# Patient Record
Sex: Female | Born: 1970
Health system: Southern US, Community
[De-identification: ages and names within clinical notes are randomized; demographics above are authoritative.]

## PROBLEM LIST (undated history)

## (undated) DIAGNOSIS — T7840XA Allergy, unspecified, initial encounter: Secondary | ICD-10-CM

## (undated) DIAGNOSIS — D649 Anemia, unspecified: Secondary | ICD-10-CM

## (undated) HISTORY — DX: Allergy, unspecified, initial encounter: T78.40XA

## (undated) HISTORY — DX: Anemia, unspecified: D64.9

---

## 1994-04-19 HISTORY — PX: CRYOTHERAPY: SHX1416

## 2008-01-24 ENCOUNTER — Ambulatory Visit: Payer: Self-pay | Admitting: Family Medicine

## 2008-01-24 DIAGNOSIS — R5382 Chronic fatigue, unspecified: Secondary | ICD-10-CM | POA: Insufficient documentation

## 2008-01-24 DIAGNOSIS — D509 Iron deficiency anemia, unspecified: Secondary | ICD-10-CM | POA: Insufficient documentation

## 2008-01-24 DIAGNOSIS — R5383 Other fatigue: Secondary | ICD-10-CM | POA: Insufficient documentation

## 2008-01-24 DIAGNOSIS — J309 Allergic rhinitis, unspecified: Secondary | ICD-10-CM | POA: Insufficient documentation

## 2008-01-24 DIAGNOSIS — R5381 Other malaise: Secondary | ICD-10-CM

## 2008-01-26 ENCOUNTER — Ambulatory Visit: Payer: Self-pay | Admitting: Family Medicine

## 2008-01-31 LAB — CONVERTED CEMR LAB
ALT: 16 units/L (ref 0–35)
Albumin: 4.1 g/dL (ref 3.5–5.2)
Alkaline Phosphatase: 46 units/L (ref 39–117)
BUN: 8 mg/dL (ref 6–23)
Basophils Relative: 0.2 % (ref 0.0–3.0)
Bilirubin, Direct: 0.2 mg/dL (ref 0.0–0.3)
CO2: 30 meq/L (ref 19–32)
Calcium: 9.1 mg/dL (ref 8.4–10.5)
Eosinophils Relative: 0.9 % (ref 0.0–5.0)
Glucose, Bld: 85 mg/dL (ref 70–99)
HCT: 33.8 % — ABNORMAL LOW (ref 36.0–46.0)
Hemoglobin: 11.8 g/dL — ABNORMAL LOW (ref 12.0–15.0)
LDL Cholesterol: 94 mg/dL (ref 0–99)
Lymphocytes Relative: 25.5 % (ref 12.0–46.0)
Monocytes Absolute: 0.6 10*3/uL (ref 0.1–1.0)
Monocytes Relative: 9.5 % (ref 3.0–12.0)
Neutro Abs: 4.3 10*3/uL (ref 1.4–7.7)
RBC: 3.46 M/uL — ABNORMAL LOW (ref 3.87–5.11)
RDW: 12.4 % (ref 11.5–14.6)
Sodium: 139 meq/L (ref 135–145)
TSH: 0.91 microintl units/mL (ref 0.35–5.50)
Total CHOL/HDL Ratio: 3.4
Total Protein: 6.8 g/dL (ref 6.0–8.3)
WBC: 6.7 10*3/uL (ref 4.5–10.5)

## 2008-02-23 ENCOUNTER — Ambulatory Visit: Payer: Self-pay | Admitting: Family Medicine

## 2008-02-23 ENCOUNTER — Encounter: Payer: Self-pay | Admitting: Family Medicine

## 2008-02-23 ENCOUNTER — Other Ambulatory Visit: Admission: RE | Admit: 2008-02-23 | Discharge: 2008-02-23 | Payer: Self-pay | Admitting: Family Medicine

## 2008-02-23 DIAGNOSIS — L708 Other acne: Secondary | ICD-10-CM | POA: Insufficient documentation

## 2008-03-01 ENCOUNTER — Encounter (INDEPENDENT_AMBULATORY_CARE_PROVIDER_SITE_OTHER): Payer: Self-pay | Admitting: *Deleted

## 2008-03-20 ENCOUNTER — Ambulatory Visit: Payer: Self-pay | Admitting: Family Medicine

## 2008-08-16 ENCOUNTER — Ambulatory Visit: Payer: Self-pay | Admitting: Internal Medicine

## 2009-05-26 ENCOUNTER — Telehealth: Payer: Self-pay | Admitting: Family Medicine

## 2009-06-03 ENCOUNTER — Ambulatory Visit: Payer: Self-pay | Admitting: Family Medicine

## 2009-06-03 ENCOUNTER — Other Ambulatory Visit: Admission: RE | Admit: 2009-06-03 | Discharge: 2009-06-03 | Payer: Self-pay | Admitting: Family Medicine

## 2009-06-05 ENCOUNTER — Encounter (INDEPENDENT_AMBULATORY_CARE_PROVIDER_SITE_OTHER): Payer: Self-pay | Admitting: *Deleted

## 2010-01-08 ENCOUNTER — Ambulatory Visit: Payer: Self-pay | Admitting: Family Medicine

## 2010-01-30 ENCOUNTER — Telehealth: Payer: Self-pay | Admitting: Family Medicine

## 2010-05-19 NOTE — Progress Notes (Signed)
Summary: flu symptoms  Phone Note Call from Patient Call back at Home Phone [phone]   Caller: Patient Call For: Kerby Nora MD Summary of Call: Patient's son was just diagnosed with having the flu, and patient says that she is starting to show symptoms. She wants to know if she can have some tamiflu called in to [facility] [address] Initial call taken by: Melody Comas,  May 26, 2009 1:20 PM    New/Updated Medications: TAMIFLU 75 MG CAPS (OSELTAMIVIR PHOSPHATE) Take one capsule by mouth twice a day Prescriptions: TAMIFLU 75 MG CAPS (OSELTAMIVIR PHOSPHATE) Take one capsule by mouth twice a day  #10 x 0   Entered and Authorized by:   Ruthe Mannan MD   Signed by:   Ruthe Mannan MD on 05/26/2009   Method used:   Electronically to        [facility]. [address] [phone]* (retail)       [address] [place], [place]  [id number]       Ph: [phone]       Fax: [phone]   RxID:   [id number]   Appended Document: flu symptoms Patient Advised.

## 2010-05-19 NOTE — Assessment & Plan Note (Signed)
Summary: Lynn Ortiz/CLE   Vital Signs:  Patient profile:   40 year old female Height:      63.5 inches Weight:      153.6 pounds BMI:     26.88 Temp:     98.5 degrees F oral Pulse rate:   76 / minute Pulse rhythm:   regular BP sitting:   110 / 70  (left arm) Cuff size:   regular  Vitals Entered By: Benny Lennert CMA Duncan Dull) (June 03, 2009 11:50 AM)  History of Present Illness: Chief complaint Lynn Ortiz  The patient is here for annual wellness exam and preventative care.      Problems Prior to Update: 1)  Routine Gynecological Examination  (ICD-V72.31) 2)  Routine General Medical Exam@health  Care Facl  (ICD-V70.0) 3)  Acne Vulgaris  (ICD-706.1) 4)  Screening For Lipoid Disorders  (ICD-V77.91) 5)  Fatigue  (ICD-780.79) 6)  Allergic Rhinitis  (ICD-477.9) 7)  Anemia-iron Deficiency  (ICD-280.9)  Current Medications (verified): 1)  Multivitamins   Tabs (Multiple Vitamin) .... Take 1 Tablet By Mouth Once A Day  Allergies (verified): No Known Drug Allergies  Past History:  Past medical, surgical, family and social histories (including risk factors) reviewed, and no changes noted (except as noted below).  Past Medical History: Reviewed history from 01/24/2008 and no changes required. Anemia-iron deficiency Allergic rhinitis  Past Surgical History: Reviewed history from 01/24/2008 and no changes required. C-section 2003,  NSVD, twin 2005 cryotherapy  for dysplasia of cervix 1996  Family History: Reviewed history from 01/24/2008 and no changes required. father: emphesema mother: healthy sister; vit B12 deficiency no MI < age 73  Social History: Reviewed history from 01/24/2008 and no changes required. 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 Resp:   Denies shortness of breath. GI:  Denies abdominal pain, bloody stools, constipation, and diarrhea. GU:  Denies abnormal vaginal bleeding, discharge, and dysuria; no breast lesions.  .  Physical Exam  General:  Well-developed,well-nourished,in no acute distress; alert,appropriate and cooperative throughout examination Head:  Normocephalic and atraumatic without obvious abnormalities. No apparent alopecia or balding. Ears:  External ear exam shows no significant lesions or deformities.  Otoscopic examination reveals clear canals, tympanic membranes are intact bilaterally without bulging, retraction, inflammation or discharge. Hearing is grossly normal bilaterally. Nose:  External nasal examination shows no deformity or inflammation. Nasal mucosa are pink and moist without lesions or exudates. Mouth:  Oral mucosa and oropharynx without lesions or exudates.  Teeth in good repair. Neck:  No deformities, masses, or tenderness noted. Chest Wall:  No deformities, masses, or tenderness noted. Breasts:  No mass, nodules, thickening, tenderness, bulging, retraction, inflamation, nipple discharge or skin changes noted.   Lungs:  Normal respiratory effort, chest expands symmetrically. Lungs are clear to auscultation, no crackles or wheezes. Heart:  Normal rate and regular rhythm. S1 and S2 normal without gallop, murmur, click, rub or other extra sounds. Abdomen:  Bowel sounds positive,abdomen soft and non-tender without masses, organomegaly or hernias noted. Genitalia:  Pelvic Exam:        External: normal female genitalia without lesions or masses        Vagina: normal without lesions or masses        Cervix: normal without lesions or masses        Adnexa: normal bimanual exam without  masses or fullness        Uterus: normal by palpation        Pap smear: performed Pulses:  R and L posterior tibial pulses are full and equal bilaterally  Extremities:  no edema Neurologic:  No cranial nerve deficits  noted. Station and gait are normal. Plantar reflexes are down-going bilaterally. DTRs are symmetrical throughout. Sensory, motor and coordinative functions appear intact. Skin:  Intact without suspicious lesions or rashes Psych:  Cognition and judgment appear intact. Alert and cooperative with normal attention span and concentration. No apparent delusions, illusions, hallucinations   Impression & Recommendations:  Problem # 1:  Preventive Health Care (ICD-V70.0) The patient's preventative maintenance and recommended screening tests for an annual wellness exam were reviewed in full today. Brought up to date unless services declined.  Counselled on the importance of diet, exercise, and its role in overall health and mortality. The patient's FH and SH was reviewed, including their home life, tobacco status, and drug and alcohol status.     Problem # 2:  Gynecological examination-routine (ICD-V72.31) PAP pending..if negative can spacce to every 2 years.   Complete Medication List: 1)  Multivitamins Tabs (Multiple vitamin) .... Take 1 tablet by mouth once a day  Patient Instructions: 1)  Please schedule a follow-up appointment in 1 year.   Current Allergies (reviewed today): No known allergies   Flu Vaccine Result Date:  01/17/2009 Flu Vaccine Result:  given Flu Vaccine Next Due:  1 yr

## 2010-05-19 NOTE — Letter (Signed)
Summary: Results Follow up Letter  [place] [facility]  [address] [place], [place] [address]   Phone: [phone]  Fax: [phone]    06/05/2009 MRN: [id number]     [facility] [address] [place], [place]  [address]    Dear Lynn Ortiz,  The following are the results of your recent test(s):  Test         Result    Pap Smear:        Normal ___x__  Not Normal _____ Comments:Repeat in 1 year ______________________________________________________ Cholesterol: LDL(Bad cholesterol):         Your goal is less than:         HDL (Good cholesterol):       Your goal is more than: Comments:  ______________________________________________________ Mammogram:        Normal _____  Not Normal _____ Comments:  ___________________________________________________________________ Hemoccult:        Normal _____  Not normal _______ Comments:    _____________________________________________________________________ Other Tests:    We routinely do not discuss normal results over the telephone.  If you desire a copy of the results, or you have any questions about this information we can discuss them at your next office visit.   Sincerely,  Kerby Nora MD

## 2010-05-19 NOTE — Progress Notes (Signed)
Summary: not much better  Phone Note Call from Patient Call back at [phone]   Caller: Patient Call For: Dr. Para March Summary of Call: Pt was seen on 9/22 for strep.  She finished her abx but has not felt better since.  Throat did get some better and fevers are now low grade.  She is asking if she should get another round of abx called to [facility] [address]   Initial call taken by: Lowella Petties CMA,  January 30, 2010 4:14 PM  Follow-up for Phone Call        Temp 99 this AM, ST some better.  Still fatigued.  I talked with patient.  I doubt she is still infected with strep w/o the fever.  I would rest over the weekend and call back as needed on Monday.   Supportive tx in meantime. She agrees. No new antibiotics.  Follow-up by: Crawford Givens MD,  January 30, 2010 5:25 PM

## 2010-05-19 NOTE — Assessment & Plan Note (Signed)
Summary: ST,FEVER/CLE   Vital Signs:  Patient profile:   40 year old female Height:      63.5 inches Weight:      153 pounds BMI:     26.77 Temp:     98.8 degrees F oral Pulse rate:   84 / minute Pulse rhythm:   regular BP sitting:   98 / 70  (left arm) Cuff size:   regular  Vitals Entered By: Delilah Shan CMA Duncan Dull) (January 08, 2010 2:17 PM) CC: ST, fever   History of Present Illness: Monday night patient noted that she wasn't feeling well.  Out of work Wednesday.  Fever 103 then. Sleeping most of yesterday and today.  ST.  No sig cough.  Feels hot.  Occ ear pain.  Recent contact with  strep case.  Minimal rhinorrhea, no GI symptoms.  Mild voice change.   Allergies: No Known Drug Allergies  Review of Systems       See HPI.  Otherwise negative.    Physical Exam  General:  GEN: nad, alert and oriented HEENT: mucous membranes moist, TM w/o erythema, nasal epithelium injected, OP with cobblestoning and bilateral tonsillar exudates NECK: supple w/ LA that is tender to palpation  CV: rrr. PULM: ctab, no inc wob ABD: soft, +bs EXT: no edema    Impression & Recommendations:  Problem # 1:  STREPTOCOCCAL PHARYNGITIS (ICD-034.0) See instructions.  d/w patient and she understands.  RST positive.  Her updated medication list for this problem includes:    Amoxicillin 875 Mg Tabs (Amoxicillin) .Marland Kitchen... 1 by mouth two times a day  Complete Medication List: 1)  Multivitamins Tabs (Multiple vitamin) .... Take 1 tablet by mouth once a day 2)  Amoxicillin 875 Mg Tabs (Amoxicillin) .Marland Kitchen.. 1 by mouth two times a day  Patient Instructions: 1)  Take the amoxicillin two times a day.  Take tylenol 500mg , 2 tabs three times a day for fever.  Gargle with salt water.  Try to rest and drink plenty of fluids.  Take care.  Prescriptions: AMOXICILLIN 875 MG TABS (AMOXICILLIN) 1 by mouth two times a day  #20 x 0   Entered and Authorized by:   Crawford Givens MD   Signed by:   Crawford Givens MD on  01/08/2010   Method used:   Electronically to        [facility]. [address] [phone]* (retail)       [address] [place], [place]  [id number]       Ph: [phone]       Fax: [phone]   RxID:   [id number]   Current Allergies (reviewed today): No known allergies

## 2010-06-05 ENCOUNTER — Other Ambulatory Visit: Payer: Self-pay | Admitting: Family Medicine

## 2010-06-05 ENCOUNTER — Encounter: Payer: Self-pay | Admitting: Family Medicine

## 2010-06-05 ENCOUNTER — Ambulatory Visit (INDEPENDENT_AMBULATORY_CARE_PROVIDER_SITE_OTHER): Payer: BC Managed Care – PPO | Admitting: Family Medicine

## 2010-06-05 DIAGNOSIS — R5383 Other fatigue: Secondary | ICD-10-CM

## 2010-06-05 DIAGNOSIS — R5381 Other malaise: Secondary | ICD-10-CM

## 2010-06-05 LAB — BASIC METABOLIC PANEL
CO2: 29 mEq/L (ref 19–32)
Chloride: 104 mEq/L (ref 96–112)
Glucose, Bld: 65 mg/dL — ABNORMAL LOW (ref 70–99)
Sodium: 139 mEq/L (ref 135–145)

## 2010-06-05 LAB — HEPATIC FUNCTION PANEL
ALT: 24 U/L (ref 0–35)
Alkaline Phosphatase: 64 U/L (ref 39–117)
Bilirubin, Direct: 0.1 mg/dL (ref 0.0–0.3)
Total Protein: 7.3 g/dL (ref 6.0–8.3)

## 2010-06-05 LAB — B12 AND FOLATE PANEL: Folate: 12.4 ng/mL (ref >5.9)

## 2010-06-09 LAB — CONVERTED CEMR LAB
Basophils Absolute: 0 10*3/uL (ref 0.0–0.1)
Hemoglobin: 12.9 g/dL (ref 12.0–15.0)
Lymphocytes Relative: 39 % (ref 12–46)
Neutro Abs: 4.1 10*3/uL (ref 1.7–7.7)
Neutrophils Relative %: 53 % (ref 43–77)
Platelets: 199 10*3/uL (ref 150–400)
RDW: 12.8 % (ref 11.5–15.5)

## 2010-06-10 ENCOUNTER — Encounter (INDEPENDENT_AMBULATORY_CARE_PROVIDER_SITE_OTHER): Payer: Self-pay | Admitting: *Deleted

## 2010-06-10 NOTE — Assessment & Plan Note (Signed)
Summary: TIRED ALL THE TIME/CLE   [employer]   Vital Signs:  Patient profile:   40 year old female Weight:      157 pounds Temp:     98.4 degrees F oral Pulse rate:   72 / minute Pulse rhythm:   regular BP sitting:   120 / 78  (left arm) Cuff size:   regular  Vitals Entered By: Lowella Petties CMA, AAMA (June 05, 2010 12:32 PM) CC: Feels tired all the time, "doesnt think like she used to"   History of Present Illness:  40 year old previously healthy female resents with worsening of fatigue in last several months  Has three children, works. finished grad Degree 03/2009 Has history of fatigue but it is worse recently.   Has gained. Minimal exercise. Also notes tat if she has several things to do she wants to lay down... cannot multiple projects as once. Feels like thinking through cotton.  Feels somewhat overwhelmed.. but not really generalize anxiety or depression.  Mother with thyroid issues.   Regular menses.. no heavy.   Hx of iron def anemia, mild. No sleep problems at night "I sleep hard", no snoring. Sometimes sleeps 10 hours, but wakes up tired.   Problems Prior to Update: 1)  Streptococcal Pharyngitis  (ICD-034.0) 2)  Routine Gynecological Examination  (ICD-V72.31) 3)  Routine General Medical Exam@health  Care Facl  (ICD-V70.0) 4)  Acne Vulgaris  (ICD-706.1) 5)  Screening For Lipoid Disorders  (ICD-V77.91) 6)  Fatigue  (ICD-780.79) 7)  Allergic Rhinitis  (ICD-477.9) 8)  Anemia-iron Deficiency  (ICD-280.9)  Current Medications (verified): 1)  Multivitamins   Tabs (Multiple Vitamin) .... Take 1 Tablet By Mouth Once A Day  Allergies: No Known Drug Allergies  Past History:  Past medical, surgical, family and social histories (including risk factors) reviewed, and no changes noted (except as noted below).  Past Medical History: Reviewed history from 01/24/2008 and no changes required. Anemia-iron deficiency Allergic rhinitis  Past Surgical History: Reviewed  history from 01/24/2008 and no changes required. C-section 2003,  NSVD, twin 2005 cryotherapy  for dysplasia of cervix 1996  Family History: Reviewed history from 01/24/2008 and no changes required. father: emphesema mother: healthy sister; vit B12 deficiency no MI < age 32  Social History: Reviewed history from 01/24/2008 and no changes required. 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 CV:  Denies chest pain or discomfort. Resp:  Denies shortness of breath. GI:  Denies abdominal pain, bloody stools, constipation, and diarrhea. GU:  Denies dysuria. Derm:  Complains of dryness. Psych:  Denies anxiety, depression, panic attacks, and suicidal thoughts/plans. Endo:  Complains of cold intolerance; denies heat intolerance; dry skin.  Physical Exam  General:  fatigued appearing female in NAd  Eyes:  No corneal or conjunctival inflammation noted. EOMI. Perrla. Funduscopic exam benign, without hemorrhages, exudates or papilledema. Vision grossly normal. Ears:  External ear exam shows no significant lesions or deformities.  Otoscopic examination reveals clear canals, tympanic membranes are intact bilaterally without bulging, retraction, inflammation or discharge. Hearing is grossly normal bilaterally. Nose:  External nasal examination shows no deformity or inflammation. Nasal mucosa are pink and moist without lesions or exudates. Mouth:  MMM Neck:  no carotid bruit or thyromegaly no cervical or supraclavicular lymphadenopathy  Lungs:  Normal respiratory effort, chest  expands symmetrically. Lungs are clear to auscultation, no crackles or wheezes. Heart:  Normal rate and regular rhythm. S1 and S2 normal without gallop, murmur, click, rub or other  extra sounds. Abdomen:  Bowel sounds positive,abdomen soft and non-tender without masses, organomegaly or hernias noted. Pulses:  R and L posterior tibial pulses are full and equal bilaterally  Extremities:  no edema  Skin:  Intact without suspicious lesions or rashes Psych:  Cognition and judgment appear intact. Alert and cooperative with normal attention span and concentration. No apparent delusions, illusions, hallucinations   Impression & Recommendations:  Problem # 1:  FATIGUE (ICD-780.79) Eval for fatigue with labs. Symptoms may be due to chronic stress, mild anxiety.. consider further treatment of this at next OV.Marland Kitchen if lab eval neg.  No cardiac or pulmfindings, no suggestion of sleep issues.   Orders: TLB-B12 + Folate Pnl (82746_82607-B12/FOL) TLB-BMP (Basic Metabolic Panel-BMET) (80048-METABOL) TLB-CBC Platelet - w/Differential (85025-CBCD) TLB-Hepatic/Liver Function Pnl (80076-HEPATIC) TLB-TSH (Thyroid Stimulating Hormone) (84443-TSH)  Complete Medication List: 1)  Multivitamins Tabs (Multiple vitamin) .... Take 1 tablet by mouth once a day  Patient Instructions: 1)  Schedule CPX in 1-2 months.  2)   eat three meals a day.. protein, fruit and veggies. 3)  Exercise 3-5 times  a day.    Orders Added: 1)  TLB-B12 + Folate Pnl [82746_82607-B12/FOL] 2)  TLB-BMP (Basic Metabolic Panel-BMET) [80048-METABOL] 3)  TLB-CBC Platelet - w/Differential [85025-CBCD] 4)  TLB-Hepatic/Liver Function Pnl [80076-HEPATIC] 5)  TLB-TSH (Thyroid Stimulating Hormone) [84443-TSH] 6)  Est. Patient Level III [[address]]    Prior Medications (reviewed today): MULTIVITAMINS   TABS (MULTIPLE VITAMIN) Take 1 tablet by mouth once a day Current Allergies: No known allergies

## 2010-06-15 ENCOUNTER — Encounter: Payer: Self-pay | Admitting: Family Medicine

## 2010-06-15 ENCOUNTER — Encounter (INDEPENDENT_AMBULATORY_CARE_PROVIDER_SITE_OTHER): Payer: BC Managed Care – PPO | Admitting: Family Medicine

## 2010-06-15 ENCOUNTER — Other Ambulatory Visit: Payer: Self-pay | Admitting: Family Medicine

## 2010-06-15 DIAGNOSIS — Z0000 Encounter for general adult medical examination without abnormal findings: Secondary | ICD-10-CM

## 2010-06-15 DIAGNOSIS — E876 Hypokalemia: Secondary | ICD-10-CM | POA: Insufficient documentation

## 2010-06-15 DIAGNOSIS — Z01419 Encounter for gynecological examination (general) (routine) without abnormal findings: Secondary | ICD-10-CM

## 2010-06-15 DIAGNOSIS — F411 Generalized anxiety disorder: Secondary | ICD-10-CM | POA: Insufficient documentation

## 2010-06-15 DIAGNOSIS — E162 Hypoglycemia, unspecified: Secondary | ICD-10-CM

## 2010-06-15 LAB — HEMOGLOBIN A1C: Hgb A1c MFr Bld: 4.5 % — ABNORMAL LOW (ref 4.6–6.5)

## 2010-06-15 LAB — CONVERTED CEMR LAB

## 2010-06-15 LAB — BASIC METABOLIC PANEL
BUN: 9 mg/dL (ref 6–23)
Calcium: 9.2 mg/dL (ref 8.4–10.5)
Creatinine, Ser: 0.6 mg/dL (ref 0.4–1.2)
GFR: 109.75 mL/min (ref >60.00)
Glucose, Bld: 76 mg/dL (ref 70–99)

## 2010-06-15 LAB — HM PAP SMEAR

## 2010-06-16 NOTE — Letter (Signed)
Summary: Generic Letter  [place] [facility]  [address] [place], [place] [address]   Phone: [phone]  Fax: [phone]    06/10/2010     Lynn Ortiz [address] [place], [place]  [address]  [place]    Dear Lynn Ortiz,   Notify pt no anemia.. have her return for lab follow up of other labs... A1C and BMET fasting Dx 780.79 Work on healthy lifestyle cahnges... regular exercise and healthy eating. Do not skip meals.        Sincerely,   Kerby Nora MD

## 2010-06-25 NOTE — Assessment & Plan Note (Signed)
Summary: cpe jrt   Vital Signs:  Patient profile:   40 year old female Height:      63.5 inches Weight:      155.0 pounds BMI:     27.12 Temp:     98.9 degrees F oral Pulse rate:   72 / minute Pulse rhythm:   regular BP sitting:   110 / 60  (left arm) Cuff size:   regular  Vitals Entered By: Benny Lennert CMA Duncan Dull) (June 15, 2010 11:31 AM)  History of Present Illness: Chief complaint cpx  The patient is here for annual wellness exam and preventative care.    Fatigue.. recent lab eval nml except low glucose... reviewed with pt in detail.  Will check BMET and A1C.     Problems Prior to Update: 1)  Routine Gynecological Examination  (ICD-V72.31) 2)  Routine General Medical Exam@health  Care Facl  (ICD-V70.0) 3)  Acne Vulgaris  (ICD-706.1) 4)  Screening For Lipoid Disorders  (ICD-V77.91) 5)  Fatigue  (ICD-780.79) 6)  Allergic Rhinitis  (ICD-477.9) 7)  Anemia-iron Deficiency  (ICD-280.9)  Current Medications (verified): 1)  Multivitamins   Tabs (Multiple Vitamin) .... Take 1 Tablet By Mouth Once A Day  Allergies (verified): No Known Drug Allergies  Past History:  Past medical, surgical, family and social histories (including risk factors) reviewed, and no changes noted (except as noted below).  Past Medical History: Reviewed history from 01/24/2008 and no changes required. Anemia-iron deficiency Allergic rhinitis  Past Surgical History: Reviewed history from 01/24/2008 and no changes required. C-section 2003,  NSVD, twin 2005 cryotherapy  for dysplasia of cervix 1996  Family History: Reviewed history from 01/24/2008 and no changes required. father: emphesema mother: healthy sister; vit B12 deficiency no MI < age 5  Social History: Reviewed history from 01/24/2008 and no changes required. 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 CV:  Denies chest pain or discomfort. Resp:  Denies shortness of breath. GI:  Denies abdominal pain. GU:  Denies dysuria.  Physical Exam  General:  Well-developed,well-nourished,in no acute distress; alert,appropriate and cooperative throughout examination Eyes:  No corneal or conjunctival inflammation noted. EOMI. Perrla. Funduscopic exam benign, without hemorrhages, exudates or papilledema. Vision grossly normal. Ears:  External ear exam shows no significant lesions or deformities.  Otoscopic examination reveals clear canals, tympanic membranes are intact bilaterally without bulging, retraction, inflammation or discharge. Hearing is grossly normal bilaterally. Nose:  External nasal examination shows no deformity or inflammation. Nasal mucosa are pink and moist without lesions or exudates. Mouth:  Oral mucosa and oropharynx without lesions or exudates.  Teeth in good repair. Neck:  no carotid bruit or thyromegaly no cervical or supraclavicular lymphadenopathy  Chest Wall:  No deformities, masses, or tenderness noted. Breasts:  No mass, nodules, thickening, tenderness, bulging, retraction, inflamation, nipple discharge or skin changes noted.   Lungs:  Normal respiratory effort, chest expands symmetrically. Lungs are clear to auscultation, no crackles or wheezes. Heart:  Normal rate and regular rhythm. S1 and S2 normal without gallop, murmur, click, rub or other extra sounds. Abdomen:  Bowel sounds positive,abdomen soft and non-tender without masses, organomegaly or hernias noted. Genitalia:  normal introitus, no external lesions, no vaginal discharge, normal uterus size and position, and no adnexal masses or tenderness.   Msk:  No deformity or scoliosis noted  of thoracic or lumbar spine.   Pulses:  R and L posterior tibial pulses are full and equal bilaterally  Extremities:  no  edema Neurologic:  No cranial nerve deficits noted. Station and gait are normal. Plantar reflexes are down-going bilaterally. DTRs are symmetrical throughout. Sensory, motor and coordinative functions appear intact. Skin:  Intact without suspicious lesions or rashes Psych:  Cognition and judgment appear intact. Alert and cooperative with normal attention span and concentration. No apparent delusions, illusions, hallucinations   Impression & Recommendations:  Problem # 1:  Preventive Health Care (ICD-V70.0) The patient's preventative maintenance and recommended screening tests for an annual wellness exam were reviewed in full today. Brought up to date unless services declined.  Counselled on the importance of diet, exercise, and its role in overall health and mortality. The patient's FH and SH was reviewed, including their home life, tobacco status, and drug and alcohol status.     Problem # 2:  ROUTINE GYNECOLOGICAL EXAMINATION (ICD-V72.31) DVE , no PAP  Problem # 3:  FATIGUE (ICD-780.79) Lab eval nml except as below.  She states she feels that being overwhelmed and stress for so long has made her drop into fatigue. Exercsiing one day 2 weeks ago.Marland Kitchen amde her feel much better.  See # 6.   Problem # 4:  HYPOGLYCEMIA (ICD-251.2) Reeval glucose and check A1C.  Eat three meals daily with snacks... high in protein and fiber.  Decrease high carb loads.  Orders: TLB-BMP (Basic Metabolic Panel-BMET) (80048-METABOL) TLB-A1C / Hgb A1C (Glycohemoglobin) (83036-A1C)  Problem # 5:  HYPOKALEMIA (ICD-276.8) Recheck.  Orders: TLB-BMP (Basic Metabolic Panel-BMET) (80048-METABOL)  Problem # 6:  ANXIETY (ICD-300.00)  Will start treating with low dose sertraline at bedtime. Follow up in 1 month.  No SI.  Her updated medication list for this problem includes:    Sertraline Hcl 25 Mg Tabs (Sertraline hcl) .Marland Kitchen... 1 tab by mouth daily at beditme  Complete Medication List: 1)  Multivitamins Tabs  (Multiple vitamin) .... Take 1 tablet by mouth once a day 2)  Sertraline Hcl 25 Mg Tabs (Sertraline hcl) .Marland Kitchen.. 1 tab by mouth daily at beditme  Patient Instructions: 1)  Start exercising.Marland Kitchen 2-3 times a week. 2)  Eat three meals a day with healthy snack in between... high in fiber and protein. 3)  Start sertraline 25 mg daily. 4)  We will call with lab results.  5)   Follow up mood in 1 month.  Prescriptions: SERTRALINE HCL 25 MG TABS (SERTRALINE HCL) 1 tab by mouth daily at beditme  #30 x 3   Entered and Authorized by:   Kerby Nora MD   Signed by:   Kerby Nora MD on 06/15/2010   Method used:   Electronically to        [facility]. [address] [phone]* (retail)       [address] [place], [place]  [id number]       Ph: [phone]       Fax: [phone]   RxID:   [id number]    Orders Added: 1)  TLB-BMP (Basic Metabolic Panel-BMET) [80048-METABOL] 2)  TLB-A1C / Hgb A1C (Glycohemoglobin) [83036-A1C] 3)  Est. Patient 18-39 years [[address]]    Current Allergies (reviewed today): No known allergies   Last PAP:  NEGATIVE FOR INTRAEPITHELIAL LESIONS OR MALIGNANCY. (06/03/2009 12:00:00 AM) PAP Result Date:  06/15/2010 PAP Result:  DVE this year, nml, pap q2-3 years PAP Next Due:  1 yr

## 2010-12-02 ENCOUNTER — Ambulatory Visit (INDEPENDENT_AMBULATORY_CARE_PROVIDER_SITE_OTHER): Payer: BC Managed Care – PPO | Admitting: Family Medicine

## 2010-12-02 ENCOUNTER — Encounter: Payer: Self-pay | Admitting: Family Medicine

## 2010-12-02 ENCOUNTER — Ambulatory Visit: Payer: BC Managed Care – PPO | Admitting: Family Medicine

## 2010-12-02 DIAGNOSIS — J209 Acute bronchitis, unspecified: Secondary | ICD-10-CM | POA: Insufficient documentation

## 2010-12-02 MED ORDER — GUAIFENESIN-CODEINE 100-10 MG/5ML PO SYRP: ORAL_SOLUTION | ORAL | Status: DC

## 2010-12-02 MED ORDER — AZITHROMYCIN 250 MG PO TABS: ORAL_TABLET | ORAL | Status: AC

## 2010-12-02 NOTE — Assessment & Plan Note (Signed)
With one week of symptoms - productive cough and fever that are persistant Reassuring exam- no reactive airways Will be traveling with her mother with copd- rev good habits to prev transmission  Will cover with zithromax and update Also robitussin ac with caution  Adv to call if worse or not improving in 1-2 weeks (pt will be on a cruise and access to Dr there as well)

## 2010-12-02 NOTE — Patient Instructions (Signed)
Drink lots of fluids Take zithromax as directed  Use cough medicine as needed with caution of sedation  Tylenol for fever  Update [place] if worse / short of breath / wheezing or not improving in 1-2 weeks

## 2010-12-02 NOTE — Progress Notes (Signed)
  Subjective:    Patient ID: Lynn Ortiz, female    DOB: December 08, 1970, 40 y.o.   MRN: [id number]  HPI Started sat with 100 deg fever - she took some tylenol which helped Congestion also in chest Taking mucinex DM   Is leaving tonight - on cruise with mother  She has copd   Took 2 tylenol at 9   Is coughing up phlegm - that is thick and green or white No blood  Not a lot of it   No nasal or ear or throat symptoms at all  Patient Active Problem List  Diagnoses  . ANEMIA-IRON DEFICIENCY  . ALLERGIC RHINITIS  . ACNE VULGARIS  . FATIGUE  . HYPOGLYCEMIA  . HYPOKALEMIA  . ANXIETY  . Acute bronchitis   Past Medical History  Diagnosis Date  . Anemia     iron deficiency  . Allergy     allerlgic rhinitis   Past Surgical History  Procedure Date  . Cesarean section     2003 NSVD, twin 2005  . Cryotherapy 1996    for dysplasia of cervix   History  Substance Use Topics  . Smoking status: Never Smoker   . Smokeless tobacco: Not on file  . Alcohol Use: Yes     2 drinks per week   Family History  Problem Relation Age of Onset  . Emphysema Father    No Known Allergies No current outpatient prescriptions on file prior to visit.         Review of Systems Review of Systems  Constitutional: Negative for fever, appetite change, fatigue and unexpected weight change.  Eyes: Negative for pain and visual disturbance.  Respiratory: neg for sob or wheezing/ pos for prod cough ENT neg for congestion/ sore throat or ear pain Cardiovascular: Negative.  for cp or palpitations Gastrointestinal: Negative for nausea, diarrhea and constipation.  Genitourinary: Negative for urgency and frequency.  Skin: Negative for pallor. Or rash Neurological: Negative for weakness, light-headedness, numbness and headaches.  Hematological: Negative for adenopathy. Does not bruise/bleed easily.  Psychiatric/Behavioral: Negative for dysphoric mood. The patient is not nervous/anxious.            Objective:   Physical Exam  Constitutional: She appears well-developed and well-nourished. No distress.  HENT:  Head: Normocephalic and atraumatic.  Right Ear: External ear normal.  Left Ear: External ear normal.  Nose: Nose normal.  Mouth/Throat: Oropharynx is clear and moist.       Some post nasal drip   Eyes: Conjunctivae and EOM are normal. Pupils are equal, round, and reactive to light. Right eye exhibits no discharge. Left eye exhibits no discharge.  Neck: Normal range of motion. Neck supple. No thyromegaly present.  Cardiovascular: Normal rate, regular rhythm and normal heart sounds.   Pulmonary/Chest: Effort normal and breath sounds normal. No respiratory distress. She has no wheezes. She has no rales.       Harsh bs throughout Also harsh sounding cough  No rales Some isolated rhonchi   Lymphadenopathy:    She has no cervical adenopathy.  Skin: Skin is warm and dry. No rash noted. No erythema. No pallor.  Psychiatric: She has a normal mood and affect.          Assessment & Plan:

## 2011-05-06 ENCOUNTER — Telehealth: Payer: Self-pay | Admitting: Family Medicine

## 2011-05-06 DIAGNOSIS — Z1231 Encounter for screening mammogram for malignant neoplasm of breast: Secondary | ICD-10-CM

## 2011-05-06 NOTE — Telephone Encounter (Signed)
Home Number [phone] Pt called in and wanted to get an order for her 1st Mammo and wanted to go to [facility]. She isn't due for a CPE until February. Not having pain or anything but she stated that now that she is 41 she was told she needs to get a Mammo.

## 2011-07-02 ENCOUNTER — Encounter: Payer: Self-pay | Admitting: Family Medicine

## 2011-07-02 ENCOUNTER — Ambulatory Visit: Payer: Self-pay | Admitting: Family Medicine

## 2011-07-05 LAB — HM MAMMOGRAPHY: HM Mammogram: NORMAL

## 2011-07-06 ENCOUNTER — Encounter: Payer: Self-pay | Admitting: *Deleted

## 2012-07-03 ENCOUNTER — Telehealth: Payer: Self-pay | Admitting: Family Medicine

## 2012-07-03 MED ORDER — PROMETHAZINE HCL 25 MG PO TABS: 25.0000 mg | ORAL_TABLET | Freq: Three times a day (TID) | ORAL | Status: DC | PRN

## 2012-07-03 NOTE — Telephone Encounter (Signed)
Patient Information:  Caller Name: Lynn Ortiz  Phone: [phone]  Patient: Lynn Ortiz, Lynn Ortiz  Gender: Female  DOB: May 25, 1970  Age: 42 Years  PCP: Kerby Nora (Family Practice)  Pregnant: No  Office Follow Up:  Does the office need to follow up with this patient?: Yes  Instructions For The Office: Patient requests prescription for Phenergan instead of coming in for an office visit as recommended by triager. Patient also reports that she is no longer taking Zoloft or any medications at this time.   Symptoms  Reason For Call & Symptoms: Reports having vomiting over the weekend. It has resolved now and she is having abdominal pain/nausea. Reports 2 episodes of vomiting today.  Reviewed Health History In EMR: Yes  Reviewed Medications In EMR: Yes  Reviewed Allergies In EMR: Yes  Reviewed Surgeries / Procedures: Yes  Date of Onset of Symptoms: 06/30/2012 OB / GYN:  LMP: 06/12/2012  Guideline(s) Used:  Vomiting  Disposition Per Guideline:   See Today in Office  Reason For Disposition Reached:   Vomiting lasts > 48 hours  Advice Given:  N/A  Patient Refused Recommendation:  Patient Requests Prescription  Requests prescription for Phenergan instead of the office visit that was recommended.

## 2012-07-03 NOTE — Telephone Encounter (Signed)
Left message on voice mail advising patient script has been sent to pharmacy. 

## 2012-07-03 NOTE — Telephone Encounter (Signed)
Phenergan 25 mg, 1 po q 8 hours prn nausea. #30, 0 refills  ok

## 2012-11-28 ENCOUNTER — Ambulatory Visit (INDEPENDENT_AMBULATORY_CARE_PROVIDER_SITE_OTHER): Payer: BC Managed Care – PPO | Admitting: Family Medicine

## 2012-11-28 ENCOUNTER — Encounter: Payer: Self-pay | Admitting: Family Medicine

## 2012-11-28 VITALS — BP 100/60 | HR 74 | Temp 98.5°F | Ht 64.0 in | Wt 165.5 lb

## 2012-11-28 DIAGNOSIS — R5381 Other malaise: Secondary | ICD-10-CM

## 2012-11-28 DIAGNOSIS — F5104 Psychophysiologic insomnia: Secondary | ICD-10-CM | POA: Insufficient documentation

## 2012-11-28 DIAGNOSIS — D509 Iron deficiency anemia, unspecified: Secondary | ICD-10-CM

## 2012-11-28 DIAGNOSIS — G4700 Insomnia, unspecified: Secondary | ICD-10-CM

## 2012-11-28 DIAGNOSIS — E876 Hypokalemia: Secondary | ICD-10-CM

## 2012-11-28 DIAGNOSIS — R5383 Other fatigue: Secondary | ICD-10-CM

## 2012-11-28 LAB — TSH: TSH: 1.02 u[IU]/mL (ref 0.35–5.50)

## 2012-11-28 LAB — COMPREHENSIVE METABOLIC PANEL
ALT: 26 U/L (ref 0–35)
AST: 28 U/L (ref 0–37)
CO2: 26 mEq/L (ref 19–32)
Chloride: 108 mEq/L (ref 96–112)
GFR: 80.31 mL/min (ref >60.00)
Sodium: 140 mEq/L (ref 135–145)
Total Bilirubin: 0.9 mg/dL (ref 0.3–1.2)
Total Protein: 7.2 g/dL (ref 6.0–8.3)

## 2012-11-28 LAB — CBC WITH DIFFERENTIAL/PLATELET
Basophils Absolute: 0 10*3/uL (ref 0.0–0.1)
Eosinophils Relative: 1.1 % (ref 0.0–5.0)
Monocytes Absolute: 0.4 10*3/uL (ref 0.1–1.0)
Monocytes Relative: 6.1 % (ref 3.0–12.0)
Neutrophils Relative %: 61.7 % (ref 43.0–77.0)
Platelets: 160 10*3/uL (ref 150.0–400.0)
RDW: 12.5 % (ref 11.5–14.6)
WBC: 6 10*3/uL (ref 4.5–10.5)

## 2012-11-28 NOTE — Assessment & Plan Note (Signed)
Discussed sleep hygiene and sleep retraining. Pt given info. May try melatonin.  Call if not improving in 3-4 weeks for trial of trazodone.

## 2012-11-28 NOTE — Assessment & Plan Note (Signed)
Likely due to sleep difficulty, but we will rule out other causes given pt personal and family history.

## 2012-11-28 NOTE — Patient Instructions (Addendum)
Review sleep hygiene. Start sleep retraining. Stop at lab on your way out. Start melatonin 3 mg at bedtime. Call if symptoms not improving in 1 month.

## 2012-11-28 NOTE — Progress Notes (Signed)
  Subjective:    Patient ID: Lynn Ortiz, female    DOB: 1970/11/19, 42 y.o.   MRN: [id number]  HPI  42 year old female with history of anemia and anxiety presents with trouble sleeping in last year, worst in last few months. She has several nights a week when she wakes up in middle of night and she cannot go back to sleep. Did not get better, even got worse with exercise earlier in year.  She feels like her mood level is better this year than last has been off sertraline  ( has only been on this). Less anxious, no depression.   She feels very fatigued and is concerned as to whether she is anemic, mother with hsitory of thyroid issues. Review of Systems  Constitutional: Negative for fever and fatigue.  HENT: Negative for ear pain.   Eyes: Negative for pain.  Respiratory: Negative for chest tightness and shortness of breath.   Cardiovascular: Negative for chest pain, palpitations and leg swelling.  Gastrointestinal: Negative for abdominal pain.  Genitourinary: Negative for dysuria.       Objective:   Physical Exam  Constitutional: Vital signs are normal. She appears well-developed and well-nourished. She is cooperative.  Non-toxic appearance. She does not appear ill. No distress.  HENT:  Head: Normocephalic.  Right Ear: Hearing, tympanic membrane, external ear and ear canal normal. Tympanic membrane is not erythematous, not retracted and not bulging.  Left Ear: Hearing, tympanic membrane, external ear and ear canal normal. Tympanic membrane is not erythematous, not retracted and not bulging.  Nose: No mucosal edema or rhinorrhea. Right sinus exhibits no maxillary sinus tenderness and no frontal sinus tenderness. Left sinus exhibits no maxillary sinus tenderness and no frontal sinus tenderness.  Mouth/Throat: Uvula is midline, oropharynx is clear and moist and mucous membranes are normal.  Eyes: Conjunctivae, EOM and lids are normal. Pupils are equal, round, and reactive to light. No  foreign bodies found.  Neck: Trachea normal and normal range of motion. Neck supple. Carotid bruit is not present. No mass and no thyromegaly present.  Cardiovascular: Normal rate, regular rhythm, S1 normal, S2 normal, normal heart sounds, intact distal pulses and normal pulses.  Exam reveals no gallop and no friction rub.   No murmur heard. Pulmonary/Chest: Effort normal and breath sounds normal. Not tachypneic. No respiratory distress. She has no decreased breath sounds. She has no wheezes. She has no rhonchi. She has no rales.  Abdominal: Soft. Normal appearance and bowel sounds are normal. There is no tenderness.  Neurological: She is alert.  Skin: Skin is warm, dry and intact. No rash noted.  Psychiatric: Her speech is normal and behavior is normal. Judgment and thought content normal. Her mood appears not anxious. Cognition and memory are normal. She does not exhibit a depressed mood.          Assessment & Plan:

## 2012-11-29 ENCOUNTER — Encounter: Payer: Self-pay | Admitting: *Deleted

## 2012-12-26 ENCOUNTER — Other Ambulatory Visit (INDEPENDENT_AMBULATORY_CARE_PROVIDER_SITE_OTHER): Payer: BC Managed Care – PPO

## 2012-12-26 ENCOUNTER — Telehealth: Payer: Self-pay | Admitting: Family Medicine

## 2012-12-26 DIAGNOSIS — Z13220 Encounter for screening for lipoid disorders: Secondary | ICD-10-CM

## 2012-12-26 DIAGNOSIS — E162 Hypoglycemia, unspecified: Secondary | ICD-10-CM

## 2012-12-26 DIAGNOSIS — D509 Iron deficiency anemia, unspecified: Secondary | ICD-10-CM

## 2012-12-26 DIAGNOSIS — E876 Hypokalemia: Secondary | ICD-10-CM

## 2012-12-26 LAB — LIPID PANEL
HDL: 46.5 mg/dL (ref >39.00)
Total CHOL/HDL Ratio: 3

## 2012-12-26 NOTE — Telephone Encounter (Signed)
Message copied by Kerby Nora E on Tue Dec 26, 2012  8:15 AM ------      Message from: Alvina Chou      Created: Thu Dec 21, 2012 11:09 AM      Regarding: Lab orders for Tuesday, 9.9.14       Patient is scheduled for CPX labs, please order future labs, Thanks , Terri       ------

## 2013-01-02 ENCOUNTER — Ambulatory Visit (INDEPENDENT_AMBULATORY_CARE_PROVIDER_SITE_OTHER): Payer: BC Managed Care – PPO | Admitting: Family Medicine

## 2013-01-02 ENCOUNTER — Encounter: Payer: Self-pay | Admitting: Family Medicine

## 2013-01-02 ENCOUNTER — Other Ambulatory Visit (HOSPITAL_COMMUNITY)
Admission: RE | Admit: 2013-01-02 | Discharge: 2013-01-02 | Disposition: A | Discharge status: Home / Self Care | Payer: BC Managed Care – PPO | Source: Ambulatory Visit | Attending: Family Medicine | Admitting: Family Medicine

## 2013-01-02 VITALS — BP 92/70 | HR 87 | Temp 98.2°F | Ht 63.5 in | Wt 164.0 lb

## 2013-01-02 DIAGNOSIS — Z124 Encounter for screening for malignant neoplasm of cervix: Secondary | ICD-10-CM

## 2013-01-02 DIAGNOSIS — Z01419 Encounter for gynecological examination (general) (routine) without abnormal findings: Secondary | ICD-10-CM | POA: Insufficient documentation

## 2013-01-02 DIAGNOSIS — Z0000 Encounter for general adult medical examination without abnormal findings: Secondary | ICD-10-CM

## 2013-01-02 DIAGNOSIS — Z1151 Encounter for screening for human papillomavirus (HPV): Secondary | ICD-10-CM | POA: Insufficient documentation

## 2013-01-02 NOTE — Patient Instructions (Signed)
Call to schedule  mammogram on your own. 

## 2013-01-02 NOTE — Progress Notes (Signed)
Subjective:    Patient ID: Lynn Ortiz, female    DOB: 1970-06-22, 42 y.o.   MRN: [id number]  HPI  42 year old female presents for wellness visit.  Last seen in 11/2012 for insomonia: trial of melatonin was helpful.  Mood remains well controlled off SSRI.  Minimal exercise.  Moderate diet. Drinks soda. Wt Readings from Last 3 Encounters:  01/02/13 164 lb (74.39 kg)  11/28/12 165 lb 8 oz (75.07 kg)  12/02/10 151 lb 4 oz (68.607 kg)   Reviewed labs in detail.   Lab Results  Component Value Date   CHOL 162 12/26/2012   HDL 46.50 12/26/2012   LDLCALC 101* 12/26/2012   TRIG 74.0 12/26/2012   CHOLHDL 3 12/26/2012      Review of Systems  Constitutional: Negative for fever, fatigue and unexpected weight change.  HENT: Negative for ear pain, congestion, sore throat, sneezing, trouble swallowing and sinus pressure.   Eyes: Negative for pain and itching.  Respiratory: Negative for cough, shortness of breath and wheezing.   Cardiovascular: Negative for chest pain, palpitations and leg swelling.  Gastrointestinal: Negative for nausea, abdominal pain, diarrhea, constipation and blood in stool.  Genitourinary: Negative for dysuria, hematuria, vaginal discharge, difficulty urinating and menstrual problem.  Skin: Negative for rash.  Neurological: Negative for syncope, weakness, light-headedness, numbness and headaches.  Psychiatric/Behavioral: Negative for confusion and dysphoric mood. The patient is not nervous/anxious.        Objective:   Physical Exam  Constitutional: Vital signs are normal. She appears well-developed and well-nourished. She is cooperative.  Non-toxic appearance. She does not appear ill. No distress.  HENT:  Head: Normocephalic.  Right Ear: Hearing, tympanic membrane, external ear and ear canal normal.  Left Ear: Hearing, tympanic membrane, external ear and ear canal normal.  Nose: Nose normal.  Eyes: Conjunctivae, EOM and lids are normal. Pupils are equal, round, and  reactive to light. Lids are everted and swept, no foreign bodies found.  Neck: Trachea normal and normal range of motion. Neck supple. Carotid bruit is not present. No mass and no thyromegaly present.  Cardiovascular: Normal rate, regular rhythm, S1 normal, S2 normal, normal heart sounds and intact distal pulses.  Exam reveals no gallop.   No murmur heard. Pulmonary/Chest: Effort normal and breath sounds normal. No respiratory distress. She has no wheezes. She has no rhonchi. She has no rales.  Abdominal: Soft. Normal appearance and bowel sounds are normal. She exhibits no distension, no fluid wave, no abdominal bruit and no mass. There is no hepatosplenomegaly. There is no tenderness. There is no rebound, no guarding and no CVA tenderness. No hernia.  Genitourinary: Vagina normal and uterus normal. No breast swelling, tenderness, discharge or bleeding. Pelvic exam was performed with patient prone. There is no rash, tenderness or lesion on the right labia. There is no rash, tenderness or lesion on the left labia. Uterus is not enlarged and not tender. Cervix exhibits no motion tenderness, no discharge and no friability. Right adnexum displays no mass, no tenderness and no fullness. Left adnexum displays no mass, no tenderness and no fullness.  Lymphadenopathy:    She has no cervical adenopathy.    She has no axillary adenopathy.  Neurological: She is alert. She has normal strength. No cranial nerve deficit or sensory deficit.  Skin: Skin is warm, dry and intact. No rash noted.  Psychiatric: Her speech is normal and behavior is normal. Judgment normal. Her mood appears not anxious. Cognition and memory are normal. She does not  exhibit a depressed mood.          Assessment & Plan:  The patient's preventative maintenance and recommended screening tests for an annual wellness exam were reviewed in full today. Brought up to date unless services declined.  Counselled on the importance of diet,  exercise, and its role in overall health and mortality. The patient's FH and SH was reviewed, including their home life, tobacco status, and drug and alcohol status.   Mammo:last nml 06/2011, due now  PAP/DVE: last pap 2009, 2011 nml, per pt priors all normal except one  around 20s. Will plan to space every 3 years, will do this year then space.  Vaccines: flu due, will do at work, Tdap uptodate  Colon: no early colon cancer early age. nonsmoker

## 2013-01-02 NOTE — Addendum Note (Signed)
Addended by: Damita Lack on: 01/02/2013 02:12 PM   Modules accepted: Orders

## 2013-02-22 ENCOUNTER — Other Ambulatory Visit: Payer: Self-pay

## 2015-10-20 DIAGNOSIS — H00014 Hordeolum externum left upper eyelid: Secondary | ICD-10-CM | POA: Diagnosis not present

## 2015-11-07 ENCOUNTER — Other Ambulatory Visit (INDEPENDENT_AMBULATORY_CARE_PROVIDER_SITE_OTHER): Payer: BLUE CROSS/BLUE SHIELD

## 2015-11-07 ENCOUNTER — Telehealth: Payer: Self-pay | Admitting: Family Medicine

## 2015-11-07 DIAGNOSIS — Z13220 Encounter for screening for lipoid disorders: Secondary | ICD-10-CM | POA: Diagnosis not present

## 2015-11-07 DIAGNOSIS — D509 Iron deficiency anemia, unspecified: Secondary | ICD-10-CM | POA: Diagnosis not present

## 2015-11-07 LAB — CBC WITH DIFFERENTIAL/PLATELET
BASOS ABS: 0 10*3/uL (ref 0.0–0.1)
Basophils Relative: 0.3 % (ref 0.0–3.0)
EOS ABS: 0.1 10*3/uL (ref 0.0–0.7)
Eosinophils Relative: 2.3 % (ref 0.0–5.0)
HCT: 36.7 % (ref 36.0–46.0)
Hemoglobin: 12.6 g/dL (ref 12.0–15.0)
LYMPHS ABS: 1.7 10*3/uL (ref 0.7–4.0)
LYMPHS PCT: 34.9 % (ref 12.0–46.0)
MCHC: 34.3 g/dL (ref 30.0–36.0)
MCV: 95.2 fl (ref 78.0–100.0)
MONO ABS: 0.4 10*3/uL (ref 0.1–1.0)
MONOS PCT: 8.2 % (ref 3.0–12.0)
NEUTROS PCT: 54.3 % (ref 43.0–77.0)
Neutro Abs: 2.7 10*3/uL (ref 1.4–7.7)
Platelets: 196 10*3/uL (ref 150.0–400.0)
RBC: 3.86 Mil/uL — AB (ref 3.87–5.11)
RDW: 13.1 % (ref 11.5–15.5)
WBC: 5 10*3/uL (ref 4.0–10.5)

## 2015-11-07 LAB — LIPID PANEL
CHOLESTEROL: 176 mg/dL (ref 0–200)
HDL: 44.4 mg/dL (ref >39.00)
LDL Cholesterol: 100 mg/dL — ABNORMAL HIGH (ref 0–99)
NonHDL: 131.59
TRIGLYCERIDES: 159 mg/dL — AB (ref 0.0–149.0)
Total CHOL/HDL Ratio: 4
VLDL: 31.8 mg/dL (ref 0.0–40.0)

## 2015-11-07 LAB — COMPREHENSIVE METABOLIC PANEL
ALBUMIN: 4.2 g/dL (ref 3.5–5.2)
ALK PHOS: 58 U/L (ref 39–117)
ALT: 16 U/L (ref 0–35)
AST: 18 U/L (ref 0–37)
BUN: 8 mg/dL (ref 6–23)
CALCIUM: 9.1 mg/dL (ref 8.4–10.5)
CHLORIDE: 105 meq/L (ref 96–112)
CO2: 28 mEq/L (ref 19–32)
CREATININE: 0.8 mg/dL (ref 0.40–1.20)
GFR: 82.64 mL/min (ref >60.00)
Glucose, Bld: 84 mg/dL (ref 70–99)
Potassium: 3.7 mEq/L (ref 3.5–5.1)
SODIUM: 139 meq/L (ref 135–145)
TOTAL PROTEIN: 6.8 g/dL (ref 6.0–8.3)
Total Bilirubin: 0.7 mg/dL (ref 0.2–1.2)

## 2015-11-07 NOTE — Telephone Encounter (Signed)
-----   Message from Alvina Chouerri J Walsh sent at 10/30/2015 10:26 AM EDT ----- Regarding: Lab orders for Friday, 7.21.17 Patient is scheduled for CPX labs, please order future labs, Thanks , Camelia Engerri

## 2015-11-10 ENCOUNTER — Other Ambulatory Visit: Payer: Self-pay | Admitting: Family Medicine

## 2015-11-14 ENCOUNTER — Other Ambulatory Visit (HOSPITAL_COMMUNITY)
Admission: RE | Admit: 2015-11-14 | Discharge: 2015-11-14 | Disposition: A | Discharge status: Home / Self Care | Payer: BLUE CROSS/BLUE SHIELD | Source: Ambulatory Visit | Attending: Family Medicine | Admitting: Family Medicine

## 2015-11-14 ENCOUNTER — Ambulatory Visit (INDEPENDENT_AMBULATORY_CARE_PROVIDER_SITE_OTHER): Payer: BLUE CROSS/BLUE SHIELD | Admitting: Family Medicine

## 2015-11-14 ENCOUNTER — Encounter: Payer: Self-pay | Admitting: Family Medicine

## 2015-11-14 VITALS — BP 115/71 | HR 82 | Temp 97.9°F | Ht 63.5 in | Wt 176.2 lb

## 2015-11-14 DIAGNOSIS — Z1151 Encounter for screening for human papillomavirus (HPV): Secondary | ICD-10-CM | POA: Diagnosis not present

## 2015-11-14 DIAGNOSIS — Z01419 Encounter for gynecological examination (general) (routine) without abnormal findings: Secondary | ICD-10-CM | POA: Diagnosis not present

## 2015-11-14 DIAGNOSIS — Z0000 Encounter for general adult medical examination without abnormal findings: Secondary | ICD-10-CM

## 2015-11-14 DIAGNOSIS — Z124 Encounter for screening for malignant neoplasm of cervix: Secondary | ICD-10-CM | POA: Diagnosis not present

## 2015-11-14 NOTE — Patient Instructions (Addendum)
Work on exercise regimen as able.  Work on healthy low carb diet. C all to schedule mammogram on own.

## 2015-11-14 NOTE — Progress Notes (Signed)
Pre visit review using our clinic review tool, if applicable. No additional management support is needed unless otherwise documented below in the visit note. 

## 2015-11-14 NOTE — Progress Notes (Signed)
45 year old female presents for wellness visit.  She is doing well overall.  Minimal exercise.  Moderate diet. Drinks soda. Wt Readings from Last 3 Encounters:  11/14/15 176 lb 4 oz (79.9 kg)  01/02/13 164 lb (74.4 kg)  11/28/12 165 lb 8 oz (75.1 kg)   Reviewed labs in detail. High triglycerides.  Lab Results  Component Value Date   CHOL 176 11/07/2015   HDL 44.40 11/07/2015   LDLCALC 100 (H) 11/07/2015   TRIG 159.0 (H) 11/07/2015   CHOLHDL 4 11/07/2015   Social History /Family History/Past Medical History reviewed and updated if needed.  Review of Systems  Constitutional: Negative for fever, fatigue and unexpected weight change.  HENT: Negative for ear pain, congestion, sore throat, sneezing, trouble swallowing and sinus pressure.   Eyes: Negative for pain and itching.  Respiratory: Negative for cough, shortness of breath and wheezing.   Cardiovascular: Negative for chest pain, palpitations and leg swelling.  Gastrointestinal: Negative for nausea, abdominal pain, diarrhea, constipation and blood in stool.  Genitourinary: Negative for dysuria, hematuria, vaginal discharge, difficulty urinating and menstrual problem.  Skin: Negative for rash.  Neurological: Negative for syncope, weakness, light-headedness, numbness and headaches.  Psychiatric/Behavioral: Negative for confusion and dysphoric mood. The patient is not nervous/anxious.        Objective:   Physical Exam  Constitutional: Vital signs are normal. She appears well-developed and well-nourished. She is cooperative.  Non-toxic appearance. She does not appear ill. No distress.  HENT:  Head: Normocephalic.  Right Ear: Hearing, tympanic membrane, external ear and ear canal normal.  Left Ear: Hearing, tympanic membrane, external ear and ear canal normal.  Nose: Nose normal.  Eyes: Conjunctivae, EOM and lids are normal. Pupils are equal, round, and reactive to light. Lids are everted and swept, no foreign bodies found.   Neck: Trachea normal and normal range of motion. Neck supple. Carotid bruit is not present. No mass and no thyromegaly present.  Cardiovascular: Normal rate, regular rhythm, S1 normal, S2 normal, normal heart sounds and intact distal pulses.  Exam reveals no gallop.   No murmur heard. Pulmonary/Chest: Effort normal and breath sounds normal. No respiratory distress. She has no wheezes. She has no rhonchi. She has no rales.  Abdominal: Soft. Normal appearance and bowel sounds are normal. She exhibits no distension, no fluid wave, no abdominal bruit and no mass. There is no hepatosplenomegaly. There is no tenderness. There is no rebound, no guarding and no CVA tenderness. No hernia.  Genitourinary: Vagina normal and uterus normal. No breast swelling, tenderness, discharge or bleeding. Pelvic exam was performed with patient prone. There is no rash, tenderness or lesion on the right labia. There is no rash, tenderness or lesion on the left labia. Uterus is not enlarged and not tender. Cervix exhibits no motion tenderness, no discharge and no friability. Right adnexum displays no mass, no tenderness and no fullness. Left adnexum displays no mass, no tenderness and no fullness.  Lymphadenopathy:    She has no cervical adenopathy.    She has no axillary adenopathy.  Neurological: She is alert. She has normal strength. No cranial nerve deficit or sensory deficit.  Skin: Skin is warm, dry and intact. No rash noted.  Psychiatric: Her speech is normal and behavior is normal. Judgment normal. Her mood appears not anxious. Cognition and memory are normal. She does not exhibit a depressed mood.          Assessment & Plan:  The patient's preventative maintenance and recommended screening  tests for an annual wellness exam were reviewed in full today. Brought up to date unless services declined.  Counselled on the importance of diet, exercise, and its role in overall health and mortality. The patient's  FH and SH was reviewed, including their home life, tobacco status, and drug and alcohol status.   Mammo:last nml 06/2011, due now  PAP/DVE:due  Vaccines: Tdap uptodate  Colon: no early colon cancer early age. Nonsmoker  STD testing:refused.

## 2015-11-14 NOTE — Addendum Note (Signed)
Addended by: Damita Lack on: 11/14/2015 12:13 PM   Modules accepted: Orders

## 2015-11-18 LAB — CYTOLOGY - PAP

## 2016-02-24 ENCOUNTER — Encounter: Payer: Self-pay | Admitting: Family Medicine

## 2016-02-24 ENCOUNTER — Ambulatory Visit (INDEPENDENT_AMBULATORY_CARE_PROVIDER_SITE_OTHER): Payer: BLUE CROSS/BLUE SHIELD | Admitting: Family Medicine

## 2016-02-24 DIAGNOSIS — B9789 Other viral agents as the cause of diseases classified elsewhere: Secondary | ICD-10-CM | POA: Diagnosis not present

## 2016-02-24 DIAGNOSIS — J069 Acute upper respiratory infection, unspecified: Secondary | ICD-10-CM | POA: Insufficient documentation

## 2016-02-24 MED ORDER — GUAIFENESIN-CODEINE 100-10 MG/5ML PO SYRP: 5.0000 mL | ORAL_SOLUTION | Freq: Every evening | ORAL | 0 refills | Status: DC | PRN

## 2016-02-24 NOTE — Patient Instructions (Signed)
Cough suppressant at night.  Mucinex DM during the day.  Rest, fluids.   Call if not improving in next 5-7 days or sooner if shortness of breath.

## 2016-02-24 NOTE — Progress Notes (Signed)
Pre visit review using our clinic review tool, if applicable. No additional management support is needed unless otherwise documented below in the visit note. 

## 2016-02-24 NOTE — Assessment & Plan Note (Signed)
Symptomatic care. Cough suppressant at night.  

## 2016-02-24 NOTE — Progress Notes (Signed)
   Subjective:    Patient ID: Lynn Ortiz, female    DOB: 1970/08/29, 45 y.o.   MRN: [id number]  Cough  This is a new problem. The current episode started in the past 7 days. The problem has been gradually worsening. The problem occurs constantly. The cough is non-productive. Associated symptoms include nasal congestion, postnasal drip, rhinorrhea, a sore throat and wheezing. Pertinent negatives include no chills, ear congestion, ear pain, fever or rash. Associated symptoms comments:  Fatigued  wheeze at times, mild SOB.. The symptoms are aggravated by lying down ( cough keeping her up at night). Risk factors: nonsmoker. Treatments tried: antihistamine, tylenol. Her past medical history is significant for environmental allergies. There is no history of asthma, bronchiectasis, bronchitis, COPD, emphysema or pneumonia.    sick contacts: none.  Review of Systems  Constitutional: Negative for chills and fever.  HENT: Positive for postnasal drip, rhinorrhea and sore throat. Negative for ear pain.   Respiratory: Positive for cough and wheezing.   Skin: Negative for rash.  Allergic/Immunologic: Positive for environmental allergies.       Objective:   Physical Exam  Constitutional: Vital signs are normal. She appears well-developed and well-nourished. She is cooperative.  Non-toxic appearance. She does not appear ill. No distress.  HENT:  Head: Normocephalic.  Right Ear: Hearing, tympanic membrane, external ear and ear canal normal. Tympanic membrane is not erythematous, not retracted and not bulging.  Left Ear: Hearing, tympanic membrane, external ear and ear canal normal. Tympanic membrane is not erythematous, not retracted and not bulging.  Nose: Mucosal edema and rhinorrhea present. Right sinus exhibits no maxillary sinus tenderness and no frontal sinus tenderness. Left sinus exhibits no maxillary sinus tenderness and no frontal sinus tenderness.  Mouth/Throat: Uvula is midline, oropharynx  is clear and moist and mucous membranes are normal.  Eyes: Conjunctivae, EOM and lids are normal. Pupils are equal, round, and reactive to light. Lids are everted and swept, no foreign bodies found.  Neck: Trachea normal and normal range of motion. Neck supple. Carotid bruit is not present. No thyroid mass and no thyromegaly present.  Cardiovascular: Normal rate, regular rhythm, S1 normal, S2 normal, normal heart sounds, intact distal pulses and normal pulses.  Exam reveals no gallop and no friction rub.   No murmur heard. Pulmonary/Chest: Effort normal and breath sounds normal. No tachypnea. No respiratory distress. She has no decreased breath sounds. She has no wheezes. She has no rhonchi. She has no rales.  Neurological: She is alert.  Skin: Skin is warm, dry and intact. No rash noted.  Psychiatric: Her speech is normal and behavior is normal. Judgment normal. Her mood appears not anxious. Cognition and memory are normal. She does not exhibit a depressed mood.          Assessment & Plan:

## 2016-03-10 ENCOUNTER — Telehealth: Payer: Self-pay

## 2016-03-10 NOTE — Telephone Encounter (Signed)
Patient is calling to request a refill on: guaiFENesin-codeine (ROBITUSSIN AC) 100-10 MG/5ML syrup  Patient said that she is mostly better, but it is still keeping her up at night. Patient is out of town, and she is not sure if you will be able to call the Rx in or if it can be faxed. Patient said if so, she could just pick it up on Monday.   [facility] [phone]

## 2016-03-14 NOTE — Telephone Encounter (Signed)
Okay to refill as requested.

## 2016-03-15 MED ORDER — GUAIFENESIN-CODEINE 100-10 MG/5ML PO SYRP: 5.0000 mL | ORAL_SOLUTION | Freq: Every evening | ORAL | 0 refills | Status: DC | PRN

## 2016-03-15 NOTE — Telephone Encounter (Signed)
[facility] [facility] [place]   Birtha notified.

## 2016-05-11 ENCOUNTER — Other Ambulatory Visit: Payer: Self-pay | Admitting: Family Medicine

## 2016-05-11 DIAGNOSIS — Z1231 Encounter for screening mammogram for malignant neoplasm of breast: Secondary | ICD-10-CM

## 2016-05-14 DIAGNOSIS — H0016 Chalazion left eye, unspecified eyelid: Secondary | ICD-10-CM | POA: Diagnosis not present

## 2016-06-04 ENCOUNTER — Ambulatory Visit
Admission: RE | Admit: 2016-06-04 | Discharge: 2016-06-04 | Disposition: A | Discharge status: Home / Self Care | Payer: BLUE CROSS/BLUE SHIELD | Source: Ambulatory Visit | Attending: Family Medicine | Admitting: Family Medicine

## 2016-06-04 DIAGNOSIS — Z1231 Encounter for screening mammogram for malignant neoplasm of breast: Secondary | ICD-10-CM | POA: Insufficient documentation

## 2016-06-08 DIAGNOSIS — H0012 Chalazion right lower eyelid: Secondary | ICD-10-CM | POA: Diagnosis not present

## 2016-06-09 DIAGNOSIS — D485 Neoplasm of uncertain behavior of skin: Secondary | ICD-10-CM | POA: Diagnosis not present

## 2017-03-03 ENCOUNTER — Telehealth: Payer: Self-pay | Admitting: Family Medicine

## 2017-03-03 ENCOUNTER — Ambulatory Visit: Payer: BLUE CROSS/BLUE SHIELD | Admitting: Family Medicine

## 2017-03-03 ENCOUNTER — Encounter: Payer: Self-pay | Admitting: Family Medicine

## 2017-03-03 ENCOUNTER — Other Ambulatory Visit: Payer: Self-pay

## 2017-03-03 VITALS — BP 98/62 | HR 77 | Temp 98.6°F | Ht 63.5 in | Wt 158.8 lb

## 2017-03-03 DIAGNOSIS — F331 Major depressive disorder, recurrent, moderate: Secondary | ICD-10-CM | POA: Diagnosis not present

## 2017-03-03 MED ORDER — VENLAFAXINE HCL ER 37.5 MG PO CP24: ORAL_CAPSULE | ORAL | 3 refills | Status: DC

## 2017-03-03 MED ORDER — VENLAFAXINE HCL ER 37.5 MG PO CP24: 37.5000 mg | ORAL_CAPSULE | Freq: Every day | ORAL | 1 refills | Status: DC

## 2017-03-03 NOTE — Progress Notes (Signed)
   Subjective:    Patient ID: Lynn Ortiz, female    DOB: 1970-06-14, 46 y.o.   MRN: [id number]  HPI   46 year old female presents for depression.  She reports she has been feeling more down in last several years.  She has decreased pleasure, somewhat sad, decreased motivation. Staying in bed, isolating herself. Decreased energy, foggy in her brain, feels slowed down. Sleeping too much., But early morning waking. Overeating.  She is overwhelmed,  No SI. PHQ9 16, very difficult.    No specific stressors of note.  Blood pressure 98/62, pulse 77, temperature 98.6 F (37 C), temperature source Oral, height 5' 3.5" (1.613 m), weight 158 lb 12 oz (72 kg), last menstrual period 01/21/2017.  Body mass index is 27.68 kg/m. Wt Readings from Last 3 Encounters:  03/03/17 158 lb 12 oz (72 kg)  02/24/16 175 lb (79.4 kg)  11/14/15 176 lb 4 oz (79.9 kg)      Review of Systems  Constitutional: Positive for activity change and appetite change. Negative for fatigue and fever.  HENT: Negative for ear pain.   Eyes: Negative for pain.  Respiratory: Negative for chest tightness and shortness of breath.   Cardiovascular: Negative for chest pain, palpitations and leg swelling.  Gastrointestinal: Negative for abdominal pain.  Genitourinary: Negative for dysuria.  Psychiatric/Behavioral: Positive for decreased concentration and dysphoric mood. Negative for self-injury. The patient is not nervous/anxious.        Objective:   Physical Exam  Constitutional: Vital signs are normal. She appears well-developed and well-nourished. She is cooperative.  Non-toxic appearance. She does not appear ill. No distress.  HENT:  Head: Normocephalic.  Right Ear: Hearing, tympanic membrane, external ear and ear canal normal. Tympanic membrane is not erythematous, not retracted and not bulging.  Left Ear: Hearing, tympanic membrane, external ear and ear canal normal. Tympanic membrane is not erythematous, not  retracted and not bulging.  Nose: No mucosal edema or rhinorrhea. Right sinus exhibits no maxillary sinus tenderness and no frontal sinus tenderness. Left sinus exhibits no maxillary sinus tenderness and no frontal sinus tenderness.  Mouth/Throat: Uvula is midline, oropharynx is clear and moist and mucous membranes are normal.  Eyes: Conjunctivae, EOM and lids are normal. Pupils are equal, round, and reactive to light. Lids are everted and swept, no foreign bodies found.  Neck: Trachea normal and normal range of motion. Neck supple. Carotid bruit is not present. No thyroid mass and no thyromegaly present.  Cardiovascular: Normal rate, regular rhythm, S1 normal, S2 normal, normal heart sounds, intact distal pulses and normal pulses. Exam reveals no gallop and no friction rub.  No murmur heard. Pulmonary/Chest: Effort normal and breath sounds normal. No tachypnea. No respiratory distress. She has no decreased breath sounds. She has no wheezes. She has no rhonchi. She has no rales.  Abdominal: Soft. Normal appearance and bowel sounds are normal. There is no tenderness.  Neurological: She is alert.  Skin: Skin is warm, dry and intact. No rash noted.  Psychiatric: Judgment and thought content normal. Her mood appears not anxious. Her affect is blunt. Her speech is delayed. She is slowed and withdrawn. Cognition and memory are impaired. She exhibits a depressed mood. She expresses no homicidal and no suicidal ideation. She expresses no suicidal plans and no homicidal plans.          Assessment & Plan:

## 2017-03-03 NOTE — Assessment & Plan Note (Signed)
Discussed options. Selected venlafaxine given postive weight loss SE.  Will start and follow up in 4 weeks.  Reviewed course, SE and length of use of medication.

## 2017-03-03 NOTE — Telephone Encounter (Signed)
Copied from CRM [id number]. Topic: Quick Communication - See Telephone Encounter >> Mar 03, 2017  4:20 PM Arlyss Gandyichardson, Tito Ausmus N, NT wrote: CRM for notification. See Telephone encounter for: Patient called and the pharmacy told her insurance was flagging the dosage of the Effexor of twice a day and they will not let the pharmacy to release the medicine. Needs to be written different before pt can get medicine from pharmacy[facility] [address] [facility] [place]  03/03/17.

## 2017-03-03 NOTE — Telephone Encounter (Signed)
[facility] with new instructions to take one tablet daily.   Spoke with Misty StanleyLisa and advised her of this but I told her to still  follow Dr. Daphine DeutscherBedsole's original instructions of one capsule daily for 7 days, then increase to 2 capsules daily.  I instructed Misty StanleyLisa to call me back when she was down to her last couple of capsules and if she is tolerating the 2 capsules daily, then we will send in new Rx for Effexor XR 75 mg to take one daily.  Misty StanleyLisa states understanding.

## 2017-03-03 NOTE — Patient Instructions (Signed)
Start antidepressant.  Increase activity and exercise.  Call if interested in counseling at any time.

## 2017-03-21 ENCOUNTER — Encounter: Payer: Self-pay | Admitting: Family Medicine

## 2017-03-22 MED ORDER — VENLAFAXINE HCL ER 75 MG PO CP24: 75.0000 mg | ORAL_CAPSULE | Freq: Every day | ORAL | 0 refills | Status: DC

## 2017-04-08 ENCOUNTER — Ambulatory Visit: Payer: BLUE CROSS/BLUE SHIELD | Admitting: Family Medicine

## 2017-04-22 ENCOUNTER — Other Ambulatory Visit: Payer: Self-pay

## 2017-04-22 ENCOUNTER — Ambulatory Visit: Payer: BLUE CROSS/BLUE SHIELD | Admitting: Family Medicine

## 2017-04-22 ENCOUNTER — Encounter: Payer: Self-pay | Admitting: Family Medicine

## 2017-04-22 VITALS — BP 90/60 | HR 90 | Temp 98.8°F | Ht 63.5 in | Wt 164.2 lb

## 2017-04-22 DIAGNOSIS — F331 Major depressive disorder, recurrent, moderate: Secondary | ICD-10-CM

## 2017-04-22 MED ORDER — VENLAFAXINE HCL ER 75 MG PO CP24: 75.0000 mg | ORAL_CAPSULE | Freq: Every day | ORAL | 1 refills | Status: DC

## 2017-04-22 NOTE — Assessment & Plan Note (Signed)
Significant improvement with venlafaxine. Had drowsiness as SE.. Will see if improves overtime. She will call  For lower dose if not improving  Follow up in 6 months.

## 2017-04-22 NOTE — Progress Notes (Signed)
   Subjective:    Patient ID: Lynn BrakemanLisa W Ortiz, female    DOB: February 11, 1971, 47 y.o.   MRN: [id number]  HPI   47 year old female presents for 4 week follow up on depression.   Depression, major moderate: Now 6 weeks after increasing venlafaxine to 75 mg daily.  Today she reports  She has noted improvement in foggy feeling. Brain fog has resolved. She is less overwhlemed.  Able to manage thing much better.   She has noted  SE of feeling drowsy. She is taking med at bedtime.  PHQ9: 18 now down to 6![id number]  Review of Systems  Constitutional: Negative for fatigue and fever.  HENT: Negative for ear pain.   Eyes: Negative for pain.  Respiratory: Negative for chest tightness and shortness of breath.   Cardiovascular: Negative for chest pain, palpitations and leg swelling.  Gastrointestinal: Negative for abdominal pain.  Genitourinary: Negative for dysuria.       Objective:   Physical Exam  Constitutional: Vital signs are normal. She appears well-developed and well-nourished. She is cooperative.  Non-toxic appearance. She does not appear ill. No distress.  HENT:  Head: Normocephalic.  Right Ear: Hearing, tympanic membrane, external ear and ear canal normal. Tympanic membrane is not erythematous, not retracted and not bulging.  Left Ear: Hearing, tympanic membrane, external ear and ear canal normal. Tympanic membrane is not erythematous, not retracted and not bulging.  Nose: No mucosal edema or rhinorrhea. Right sinus exhibits no maxillary sinus tenderness and no frontal sinus tenderness. Left sinus exhibits no maxillary sinus tenderness and no frontal sinus tenderness.  Mouth/Throat: Uvula is midline, oropharynx is clear and moist and mucous membranes are normal.  Eyes: Conjunctivae, EOM and lids are normal. Pupils are equal, round, and reactive to light. Lids are everted and swept, no foreign bodies found.  Neck: Trachea normal and normal range of motion. Neck supple. Carotid  bruit is not present. No thyroid mass and no thyromegaly present.  Cardiovascular: Normal rate, regular rhythm, S1 normal, S2 normal, normal heart sounds, intact distal pulses and normal pulses. Exam reveals no gallop and no friction rub.  No murmur heard. Pulmonary/Chest: Effort normal and breath sounds normal. No tachypnea. No respiratory distress. She has no decreased breath sounds. She has no wheezes. She has no rhonchi. She has no rales.  Abdominal: Soft. Normal appearance and bowel sounds are normal. There is no tenderness.  Neurological: She is alert.  Skin: Skin is warm, dry and intact. No rash noted.  Psychiatric: Her speech is normal and behavior is normal. Judgment and thought content normal. Her mood appears not anxious. Cognition and memory are normal. She does not exhibit a depressed mood.          Assessment & Plan:

## 2017-04-22 NOTE — Patient Instructions (Signed)
Call if drowsiness is not improving overtime.. For consideration of decreased dose or change of medicine.

## 2017-06-06 ENCOUNTER — Encounter: Payer: Self-pay | Admitting: Family Medicine

## 2017-06-09 MED ORDER — VENLAFAXINE HCL ER 37.5 MG PO CP24: 37.5000 mg | ORAL_CAPSULE | Freq: Every day | ORAL | 1 refills | Status: DC

## 2017-06-09 NOTE — Telephone Encounter (Signed)
Can you put in the correct pharmacyand send the rx.. [facility] [place]. Thanks

## 2017-06-10 ENCOUNTER — Encounter: Payer: Self-pay | Admitting: Family Medicine

## 2017-06-10 ENCOUNTER — Other Ambulatory Visit: Payer: Self-pay

## 2017-06-10 ENCOUNTER — Ambulatory Visit: Payer: BLUE CROSS/BLUE SHIELD | Admitting: Family Medicine

## 2017-06-10 DIAGNOSIS — J0100 Acute maxillary sinusitis, unspecified: Secondary | ICD-10-CM | POA: Diagnosis not present

## 2017-06-10 DIAGNOSIS — J019 Acute sinusitis, unspecified: Secondary | ICD-10-CM | POA: Insufficient documentation

## 2017-06-10 MED ORDER — GUAIFENESIN-CODEINE 100-10 MG/5ML PO SYRP: 5.0000 mL | ORAL_SOLUTION | Freq: Every evening | ORAL | 0 refills | Status: DC | PRN

## 2017-06-10 NOTE — Progress Notes (Signed)
   Subjective:    Patient ID: Lynn BrakemanLisa W Ortiz, female    DOB: 03-07-1971, 47 y.o.   MRN: [id number]  Cough  This is a new problem. The current episode started in the past 7 days ( 4 days). The problem has been gradually worsening. The cough is productive of sputum. Associated symptoms include a fever, headaches, nasal congestion and a sore throat. Pertinent negatives include no ear congestion, ear pain, myalgias, shortness of breath or wheezing. Associated symptoms comments:  4 days ago 100 F. The symptoms are aggravated by lying down. Risk factors: nonsmoker. She has tried OTC cough suppressant and prescription cough suppressant for the symptoms. The treatment provided moderate relief. Her past medical history is significant for environmental allergies. There is no history of asthma or COPD.  Fever   Associated symptoms include coughing, headaches and a sore throat. Pertinent negatives include no ear pain or wheezing.    No antibiotics in last month  Son had flu 3-4 weeks ago.  Works at [employer]   Review of Systems  Constitutional: Positive for fever.  HENT: Positive for sore throat. Negative for ear pain.   Respiratory: Positive for cough. Negative for shortness of breath and wheezing.   Musculoskeletal: Negative for myalgias.  Allergic/Immunologic: Positive for environmental allergies.  Neurological: Positive for headaches.       Objective:   Physical Exam  Constitutional: Vital signs are normal. She appears well-developed and well-nourished. She is cooperative.  Non-toxic appearance. She does not appear ill. No distress.  HENT:  Head: Normocephalic.  Right Ear: Hearing, external ear and ear canal normal. Tympanic membrane is not erythematous, not retracted and not bulging. A middle ear effusion is present.  Left Ear: Hearing, external ear and ear canal normal. Tympanic membrane is not erythematous, not retracted and not bulging. A middle ear effusion is present.  Nose: Mucosal  edema and rhinorrhea present. Right sinus exhibits maxillary sinus tenderness. Right sinus exhibits no frontal sinus tenderness. Left sinus exhibits maxillary sinus tenderness. Left sinus exhibits no frontal sinus tenderness.  Mouth/Throat: Uvula is midline and mucous membranes are normal. Posterior oropharyngeal erythema present. No oropharyngeal exudate.  Eyes: Conjunctivae, EOM and lids are normal. Pupils are equal, round, and reactive to light. Lids are everted and swept, no foreign bodies found.  Neck: Trachea normal and normal range of motion. Neck supple. Carotid bruit is not present. No thyroid mass and no thyromegaly present.  Cardiovascular: Normal rate, regular rhythm, S1 normal, S2 normal, normal heart sounds, intact distal pulses and normal pulses. Exam reveals no gallop and no friction rub.  No murmur heard. Pulmonary/Chest: Effort normal and breath sounds normal. No tachypnea. No respiratory distress. She has no decreased breath sounds. She has no wheezes. She has no rhonchi. She has no rales.  Neurological: She is alert.  Skin: Skin is warm, dry and intact. No rash noted.  Psychiatric: Her speech is normal and behavior is normal. Judgment normal. Her mood appears not anxious. Cognition and memory are normal. She does not exhibit a depressed mood.          Assessment & Plan:

## 2017-06-10 NOTE — Patient Instructions (Addendum)
Mucinex D twice daily.  Use ibuprofen 800 mg three times a day for sinus pain, headache.  Start nasal saline spray to irrigation 2-3 times daily.  Also can add flonase.  Can use codeine cough suppressant at night.  if new fever or not improving as expected in next week.. Call for possible need for antibiotics.

## 2017-06-10 NOTE — Assessment & Plan Note (Signed)
Most likely viral in origin, no sign of bacterial infection.  treat symptomatically.

## 2018-11-28 ENCOUNTER — Telehealth: Payer: Self-pay | Admitting: Family Medicine

## 2018-11-28 NOTE — Telephone Encounter (Signed)
Appointment 8/13 Pt aware

## 2018-11-28 NOTE — Telephone Encounter (Signed)
Left message asking pt to call office   Appointment Request From: Lynn Ortiz  With Provider: Eliezer Lofts, MD [facility] [place]  Preferred Date Range: Any date 11/30/2018 or later  Preferred Times: Any  Reason: To address the following health maintenance concerns. Mammogram Tetanus/Tdap Pap Smear-Modifier  Comments: I need to have a breast lump checked out. I may need a mammogram first as past due.

## 2018-11-30 ENCOUNTER — Encounter: Payer: Self-pay | Admitting: Family Medicine

## 2018-11-30 ENCOUNTER — Other Ambulatory Visit: Payer: Self-pay

## 2018-11-30 ENCOUNTER — Ambulatory Visit (INDEPENDENT_AMBULATORY_CARE_PROVIDER_SITE_OTHER): Payer: BC Managed Care – PPO | Admitting: Family Medicine

## 2018-11-30 VITALS — BP 106/68 | HR 88 | Temp 97.9°F | Ht 63.5 in | Wt 178.2 lb

## 2018-11-30 DIAGNOSIS — Z1231 Encounter for screening mammogram for malignant neoplasm of breast: Secondary | ICD-10-CM | POA: Insufficient documentation

## 2018-11-30 DIAGNOSIS — N6082 Other benign mammary dysplasias of left breast: Secondary | ICD-10-CM | POA: Diagnosis not present

## 2018-11-30 MED ORDER — AMOXICILLIN-POT CLAVULANATE 875-125 MG PO TABS: 1.0000 | ORAL_TABLET | Freq: Two times a day (BID) | ORAL | 0 refills | Status: DC

## 2018-11-30 NOTE — Assessment & Plan Note (Signed)
On medial L breast/chest wall -with redness /tenderness and no drainage Consistent with infected sebaceous/dermoid cyst tx with augmentin Keep clean with soap and water  Warm compress prn  Ref to dermatology for eval and poss removal

## 2018-11-30 NOTE — Progress Notes (Signed)
Subjective:    Patient ID: Lynn Ortiz, female    DOB: 1970-12-18, 48 y.o.   MRN: [id number]  HPI Pt presents with a lump in her L breast   48 yo pt of Dr Diona Browner  Last month had a spot on skin of L breast Had a head on it  Tried to express it  Sore to the touch /is hard and still there   No nipple d/c   LMP was in July  husb had vasectomy   Last mammogram 2/18  MM DIGITAL SCREENING BILATERAL (Accession [id number]) (Order [id number]) Imaging Date: 06/04/2016 Department: [facility] Released By: Temple Pacini Authorizing: Jinny Sanders, MD  Exam Information  Status Exam Begun  Exam Ended   Final [99] 06/04/2016 4:15 PM 06/04/2016 4:25 PM  Study Result  CLINICAL DATA:  Screening.  EXAM: DIGITAL SCREENING BILATERAL MAMMOGRAM WITH CAD  COMPARISON:  Previous exam(s).  ACR Breast Density Category c: The breast tissue is heterogeneously dense, which may obscure small masses.  FINDINGS: There are no findings suspicious for malignancy. Images were processed with CAD.  IMPRESSION: No mammographic evidence of malignancy. A result letter of this screening mammogram will be mailed directly to the patient.  RECOMMENDATION: Screening mammogram in one year. (Code:SM-B-01Y)  BI-RADS CATEGORY  1: Negative.   Electronically Signed   By: Abelardo Diesel M.D.     Patient Active Problem List   Diagnosis Date Noted  . Cyst of skin of breast, left 11/30/2018  . Screening mammogram, encounter for 11/30/2018  . Acute sinus infection 06/10/2017  . Depression, major, recurrent, moderate ([place]) 03/03/2017  . HYPOGLYCEMIA 06/15/2010  . HYPOKALEMIA 06/15/2010  . ACNE VULGARIS 02/23/2008  . ANEMIA-IRON DEFICIENCY 01/24/2008  . ALLERGIC RHINITIS 01/24/2008  . FATIGUE 01/24/2008   Past Medical History:  Diagnosis Date  . Allergy    allerlgic rhinitis  . Anemia    iron deficiency   Past Surgical History:  Procedure  Laterality Date  . CESAREAN SECTION     2003 NSVD, twin 2005  . CRYOTHERAPY  1996   for dysplasia of cervix   Social History   Tobacco Use  . Smoking status: Never Smoker  . Smokeless tobacco: Never Used  Substance Use Topics  . Alcohol use: Yes    Comment: 2 drinks per week  . Drug use: No   Family History  Problem Relation Age of Onset  . Emphysema Father    No Known Allergies Current Outpatient Medications on File Prior to Visit  Medication Sig Dispense Refill  . acetaminophen (TYLENOL) 500 MG tablet Take 500 mg by mouth every 6 (six) hours as needed.    . Multiple Vitamin (MULTIVITAMIN) tablet Take 1 tablet by mouth daily.       No current facility-administered medications on file prior to visit.     Review of Systems  Constitutional: Negative for activity change, appetite change, fatigue, fever and unexpected weight change.  HENT: Negative for congestion, ear pain, rhinorrhea, sinus pressure and sore throat.   Eyes: Negative for pain, redness and visual disturbance.  Respiratory: Negative for cough, shortness of breath and wheezing.   Cardiovascular: Negative for chest pain and palpitations.  Gastrointestinal: Negative for abdominal pain, blood in stool, constipation and diarrhea.  Endocrine: Negative for polydipsia and polyuria.  Genitourinary: Negative for dysuria, frequency, genital sores, menstrual problem, pelvic pain and urgency.  Musculoskeletal: Negative for arthralgias, back pain and myalgias.  Skin: Negative for pallor and  rash.       Red bump on breast/chest Sore to the touch  Allergic/Immunologic: Negative for environmental allergies.  Neurological: Negative for dizziness, syncope and headaches.  Hematological: Negative for adenopathy. Does not bruise/bleed easily.  Psychiatric/Behavioral: Negative for decreased concentration and dysphoric mood. The patient is not nervous/anxious.        Objective:   Physical Exam Constitutional:      General: She  is not in acute distress.    Appearance: Normal appearance. She is obese. She is not ill-appearing.  HENT:     Head: Normocephalic and atraumatic.  Eyes:     General: No scleral icterus.    Conjunctiva/sclera: Conjunctivae normal.     Pupils: Pupils are equal, round, and reactive to light.  Neck:     Musculoskeletal: Normal range of motion and neck supple. No muscular tenderness.  Cardiovascular:     Rate and Rhythm: Normal rate and regular rhythm.     Heart sounds: Normal heart sounds.  Pulmonary:     Effort: Pulmonary effort is normal. No respiratory distress.     Breath sounds: Normal breath sounds. No wheezing.  Genitourinary:    Comments: Breast exam: No mass, nodules, thickening, tenderness, bulging, retraction, inflamation, or nipple discharge  No axillary or clavicular LA.      On medial L breast (chest wall) there is a 1.5 cm mobile skin lump with erythema and tenderness -consistent with infected skin cyst (very superficial-does not feel like breast tissue) No streaks or open areas Lymphadenopathy:     Cervical: No cervical adenopathy.  Neurological:     Mental Status: She is alert.           Assessment & Plan:   Problem List Items Addressed This Visit      Musculoskeletal and Integument   Cyst of skin of breast, left - Primary    On medial L breast/chest wall -with redness /tenderness and no drainage Consistent with infected sebaceous/dermoid cyst tx with augmentin Keep clean with soap and water  Warm compress prn  Ref to dermatology for eval and poss removal        Relevant Orders   Ambulatory referral to Dermatology     Other   Screening mammogram, encounter for    Pt is overdue for screening mammogram  Ref done for [place]  She will schedule her own appt      Relevant Orders   MM 3D SCREEN BREAST BILATERAL

## 2018-11-30 NOTE — Patient Instructions (Signed)
I put the mammogram order in so you can call and schedule   I think you have a skin cyst in breast /chest wall It may be an infected sebaceous cyst  Take the augmentin as directed  Use warm compresses   I placed referral to dermatology  Our office will call you about this

## 2018-11-30 NOTE — Assessment & Plan Note (Signed)
Pt is overdue for screening mammogram  Ref done for [place]  She will schedule her own appt

## 2018-12-08 DIAGNOSIS — L720 Epidermal cyst: Secondary | ICD-10-CM | POA: Diagnosis not present

## 2019-01-17 ENCOUNTER — Ambulatory Visit
Admission: RE | Admit: 2019-01-17 | Discharge: 2019-01-17 | Disposition: A | Discharge status: Home / Self Care | Payer: BC Managed Care – PPO | Source: Ambulatory Visit | Attending: Family Medicine | Admitting: Family Medicine

## 2019-01-17 DIAGNOSIS — Z1231 Encounter for screening mammogram for malignant neoplasm of breast: Secondary | ICD-10-CM | POA: Insufficient documentation

## 2019-04-08 DIAGNOSIS — Z20828 Contact with and (suspected) exposure to other viral communicable diseases: Secondary | ICD-10-CM | POA: Diagnosis not present

## 2019-11-14 ENCOUNTER — Ambulatory Visit (INDEPENDENT_AMBULATORY_CARE_PROVIDER_SITE_OTHER): Payer: BC Managed Care – PPO | Admitting: Family Medicine

## 2019-11-14 ENCOUNTER — Other Ambulatory Visit: Payer: Self-pay

## 2019-11-14 ENCOUNTER — Encounter: Payer: Self-pay | Admitting: Family Medicine

## 2019-11-14 VITALS — BP 100/62 | HR 80 | Temp 97.1°F | Ht 63.5 in | Wt 174.5 lb

## 2019-11-14 DIAGNOSIS — B372 Candidiasis of skin and nail: Secondary | ICD-10-CM | POA: Insufficient documentation

## 2019-11-14 DIAGNOSIS — Z131 Encounter for screening for diabetes mellitus: Secondary | ICD-10-CM

## 2019-11-14 DIAGNOSIS — Z0000 Encounter for general adult medical examination without abnormal findings: Secondary | ICD-10-CM

## 2019-11-14 DIAGNOSIS — Z1159 Encounter for screening for other viral diseases: Secondary | ICD-10-CM

## 2019-11-14 DIAGNOSIS — R5383 Other fatigue: Secondary | ICD-10-CM

## 2019-11-14 DIAGNOSIS — F331 Major depressive disorder, recurrent, moderate: Secondary | ICD-10-CM

## 2019-11-14 DIAGNOSIS — Z13220 Encounter for screening for lipoid disorders: Secondary | ICD-10-CM

## 2019-11-14 DIAGNOSIS — D509 Iron deficiency anemia, unspecified: Secondary | ICD-10-CM | POA: Diagnosis not present

## 2019-11-14 DIAGNOSIS — Z23 Encounter for immunization: Secondary | ICD-10-CM | POA: Diagnosis not present

## 2019-11-14 MED ORDER — NYSTATIN 100000 UNIT/GM EX CREA: 1.0000 "application " | TOPICAL_CREAM | Freq: Two times a day (BID) | CUTANEOUS | 1 refills | Status: DC

## 2019-11-14 NOTE — Progress Notes (Signed)
Chief Complaint  Patient presents with  . Annual Exam    History of Present Illness: HPI The patient is here for annual wellness exam and preventative care.    LAST CPX 2017  MDD:On no medication.  PHQ2:0  Stress at work.  Exercise: minimial  Diet: moderate  Hx of iron def anemia Due for lab re-eval.. last labs 2017  Wt Readings from Last 3 Encounters:  11/14/19 174 lb 8 oz (79.2 kg)  11/30/18 178 lb 3 oz (80.8 kg)  06/10/17 161 lb 8 oz (73.3 kg)    This visit occurred during the SARS-CoV-2 public health emergency.  Safety protocols were in place, including screening questions prior to the visit, additional usage of staff PPE, and extensive cleaning of exam room while observing appropriate contact time as indicated for disinfecting solutions.   COVID 19 screen:  No recent travel or known exposure to COVID19 The patient denies respiratory symptoms of COVID 19 at this time. The importance of social distancing was discussed today.     Review of Systems  Constitutional: Negative for chills and fever.  HENT: Negative for congestion and ear pain.   Eyes: Negative for pain and redness.  Respiratory: Negative for cough and shortness of breath.   Cardiovascular: Negative for chest pain, palpitations and leg swelling.  Gastrointestinal: Negative for abdominal pain, blood in stool, constipation, diarrhea, nausea and vomiting.  Genitourinary: Negative for dysuria.  Musculoskeletal: Negative for falls and myalgias.  Skin: Negative for rash.  Neurological: Negative for dizziness.  Psychiatric/Behavioral: Negative for depression. The patient is not nervous/anxious.       Past Medical History:  Diagnosis Date  . Allergy    allerlgic rhinitis  . Anemia    iron deficiency    reports that she has never smoked. She has never used smokeless tobacco. She reports current alcohol use. She reports that she does not use drugs.   Current Outpatient Medications:  .  acetaminophen  (TYLENOL) 500 MG tablet, Take 500 mg by mouth every 6 (six) hours as needed., Disp: , Rfl:  .  CALCIUM PO, Take 1 tablet by mouth daily., Disp: , Rfl:  .  melatonin 5 MG TABS, Take 5 mg by mouth at bedtime as needed., Disp: , Rfl:  .  Multiple Vitamin (MULTIVITAMIN) tablet, Take 1 tablet by mouth daily.  , Disp: , Rfl:    Observations/Objective: Pulse 80, temperature (!) 97.1 F (36.2 C), temperature source Temporal, height 5' 3.5" (1.613 m), weight 174 lb 8 oz (79.2 kg), last menstrual period 10/17/2019, SpO2 97 %. BP Readings from Last 3 Encounters:  11/14/19 (!) 100/62  11/30/18 106/68  06/10/17 90/60     Physical Exam Constitutional:      General: She is not in acute distress.    Appearance: Normal appearance. She is well-developed. She is obese. She is not ill-appearing or toxic-appearing.  HENT:     Head: Normocephalic.     Right Ear: Hearing, tympanic membrane, ear canal and external ear normal.     Left Ear: Hearing, tympanic membrane, ear canal and external ear normal.     Nose: Nose normal.  Eyes:     General: Lids are normal. Lids are everted, no foreign bodies appreciated.     Conjunctiva/sclera: Conjunctivae normal.     Pupils: Pupils are equal, round, and reactive to light.  Neck:     Thyroid: No thyroid mass or thyromegaly.     Vascular: No carotid bruit.     Trachea: Trachea  normal.  Cardiovascular:     Rate and Rhythm: Normal rate and regular rhythm.     Heart sounds: Normal heart sounds, S1 normal and S2 normal. No murmur heard.  No gallop.   Pulmonary:     Effort: Pulmonary effort is normal. No respiratory distress.     Breath sounds: Normal breath sounds. No wheezing, rhonchi or rales.  Abdominal:     General: Bowel sounds are normal. There is no distension or abdominal bruit.     Palpations: Abdomen is soft. There is no fluid wave or mass.     Tenderness: There is no abdominal tenderness. There is no guarding or rebound.     Hernia: No hernia is  present.  Musculoskeletal:     Cervical back: Normal range of motion and neck supple.  Lymphadenopathy:     Cervical: No cervical adenopathy.  Skin:    General: Skin is warm and dry.     Findings: No rash.     Comments:  Erythema under left breast.  Neurological:     Mental Status: She is alert.     Cranial Nerves: No cranial nerve deficit.     Sensory: No sensory deficit.  Psychiatric:        Mood and Affect: Mood is not anxious or depressed.        Speech: Speech normal.        Behavior: Behavior normal. Behavior is cooperative.        Judgment: Judgment normal.      Assessment and Plan    The patient's preventative maintenance and recommended screening tests for an annual wellness exam were reviewed in full today. Brought up to date unless services declined.  Counselled on the importance of diet, exercise, and its role in overall health and mortality. The patient's FH and SH was reviewed, including their home life, tobacco status, and drug and alcohol status.   Mammo:last nml 12/2018 PAP/DVE:10/2015, due in 2022 Vaccines: Td due, COVID19 vaccine uptodate Colon: no colon cancer early age in family... she would like to wait until age 78. Nonsmoker  STD testing:refused Hep: will check  Candidal intertrigo Keep area dry. Treat with nystatin cream.  Fatigue  Eval with labs.  ANEMIA-IRON DEFICIENCY Heavy menses.Marland Kitchen due for re-eval.  Depression, major, recurrent, moderate (HCC) Sable control on no medication.   Kerby Nora, MD

## 2019-11-14 NOTE — Assessment & Plan Note (Signed)
Sable control on no medication.

## 2019-11-14 NOTE — Patient Instructions (Addendum)
Call to set up mammogram on your own in 12/2019.  Return for labs when fasting.

## 2019-11-14 NOTE — Assessment & Plan Note (Signed)
Heavy menses.Marland Kitchen due for re-eval.

## 2019-11-14 NOTE — Assessment & Plan Note (Signed)
Eval with labs. 

## 2019-11-14 NOTE — Addendum Note (Signed)
Addended by: Damita Lack on: 11/14/2019 09:37 AM   Modules accepted: Orders

## 2019-11-14 NOTE — Assessment & Plan Note (Signed)
Keep area dry. Treat with nystatin cream.

## 2019-11-15 ENCOUNTER — Other Ambulatory Visit: Payer: BC Managed Care – PPO

## 2019-11-22 ENCOUNTER — Other Ambulatory Visit: Payer: Self-pay

## 2019-11-22 ENCOUNTER — Other Ambulatory Visit (INDEPENDENT_AMBULATORY_CARE_PROVIDER_SITE_OTHER): Payer: BC Managed Care – PPO

## 2019-11-22 DIAGNOSIS — D509 Iron deficiency anemia, unspecified: Secondary | ICD-10-CM

## 2019-11-22 DIAGNOSIS — Z1159 Encounter for screening for other viral diseases: Secondary | ICD-10-CM | POA: Diagnosis not present

## 2019-11-22 DIAGNOSIS — R5383 Other fatigue: Secondary | ICD-10-CM | POA: Diagnosis not present

## 2019-11-22 DIAGNOSIS — Z13220 Encounter for screening for lipoid disorders: Secondary | ICD-10-CM | POA: Diagnosis not present

## 2019-11-22 DIAGNOSIS — Z131 Encounter for screening for diabetes mellitus: Secondary | ICD-10-CM | POA: Diagnosis not present

## 2019-11-22 LAB — LIPID PANEL
Cholesterol: 202 mg/dL — ABNORMAL HIGH (ref 0–200)
HDL: 53 mg/dL (ref >39.00)
LDL Cholesterol: 117 mg/dL — ABNORMAL HIGH (ref 0–99)
NonHDL: 148.79
Total CHOL/HDL Ratio: 4
Triglycerides: 161 mg/dL — ABNORMAL HIGH (ref 0.0–149.0)
VLDL: 32.2 mg/dL (ref 0.0–40.0)

## 2019-11-22 LAB — COMPREHENSIVE METABOLIC PANEL
ALT: 41 U/L — ABNORMAL HIGH (ref 0–35)
AST: 27 U/L (ref 0–37)
Albumin: 4.3 g/dL (ref 3.5–5.2)
Alkaline Phosphatase: 76 U/L (ref 39–117)
BUN: 11 mg/dL (ref 6–23)
CO2: 28 mEq/L (ref 19–32)
Calcium: 9.2 mg/dL (ref 8.4–10.5)
Chloride: 104 mEq/L (ref 96–112)
Creatinine, Ser: 0.78 mg/dL (ref 0.40–1.20)
GFR: 78.65 mL/min (ref >60.00)
Glucose, Bld: 87 mg/dL (ref 70–99)
Potassium: 3.5 mEq/L (ref 3.5–5.1)
Sodium: 139 mEq/L (ref 135–145)
Total Bilirubin: 0.7 mg/dL (ref 0.2–1.2)
Total Protein: 6.7 g/dL (ref 6.0–8.3)

## 2019-11-22 LAB — CBC WITH DIFFERENTIAL/PLATELET
Basophils Absolute: 0 10*3/uL (ref 0.0–0.1)
Basophils Relative: 0.5 % (ref 0.0–3.0)
Eosinophils Absolute: 0.2 10*3/uL (ref 0.0–0.7)
Eosinophils Relative: 3.4 % (ref 0.0–5.0)
HCT: 36.5 % (ref 36.0–46.0)
Hemoglobin: 12.9 g/dL (ref 12.0–15.0)
Lymphocytes Relative: 37.3 % (ref 12.0–46.0)
Lymphs Abs: 1.9 10*3/uL (ref 0.7–4.0)
MCHC: 35.4 g/dL (ref 30.0–36.0)
MCV: 94.7 fl (ref 78.0–100.0)
Monocytes Absolute: 0.4 10*3/uL (ref 0.1–1.0)
Monocytes Relative: 8.6 % (ref 3.0–12.0)
Neutro Abs: 2.5 10*3/uL (ref 1.4–7.7)
Neutrophils Relative %: 50.2 % (ref 43.0–77.0)
Platelets: 195 10*3/uL (ref 150.0–400.0)
RBC: 3.86 Mil/uL — ABNORMAL LOW (ref 3.87–5.11)
RDW: 12.3 % (ref 11.5–15.5)
WBC: 5.1 10*3/uL (ref 4.0–10.5)

## 2019-11-22 LAB — VITAMIN D 25 HYDROXY (VIT D DEFICIENCY, FRACTURES): VITD: 31.59 ng/mL (ref 30.00–100.00)

## 2019-11-22 LAB — IBC + FERRITIN
Ferritin: 42.7 ng/mL (ref 10.0–291.0)
Iron: 130 ug/dL (ref 42–145)
Saturation Ratios: 34.8 % (ref 20.0–50.0)
Transferrin: 267 mg/dL (ref 212.0–360.0)

## 2019-11-22 LAB — HEMOGLOBIN A1C: Hgb A1c MFr Bld: 4.6 % (ref 4.6–6.5)

## 2019-11-22 LAB — TSH: TSH: 1.85 u[IU]/mL (ref 0.35–4.50)

## 2019-11-22 LAB — VITAMIN B12: Vitamin B-12: 555 pg/mL (ref 211–911)

## 2019-11-23 LAB — HEPATITIS C ANTIBODY
Hepatitis C Ab: NONREACTIVE
SIGNAL TO CUT-OFF: 0.01 (ref <1.00)

## 2020-02-25 DIAGNOSIS — Z20822 Contact with and (suspected) exposure to covid-19: Secondary | ICD-10-CM | POA: Diagnosis not present

## 2020-02-25 DIAGNOSIS — J0140 Acute pansinusitis, unspecified: Secondary | ICD-10-CM | POA: Diagnosis not present

## 2020-02-25 DIAGNOSIS — M791 Myalgia, unspecified site: Secondary | ICD-10-CM | POA: Diagnosis not present

## 2020-02-28 ENCOUNTER — Telehealth: Payer: Self-pay

## 2020-02-28 NOTE — Telephone Encounter (Signed)
Noted  

## 2020-02-28 NOTE — Telephone Encounter (Signed)
I spoke with pt; pt went to [facility] in [place] on 02/25/20 had rapid covid test that was neg. Did not do PCR test but pt had that done at [facility] and got results 02/28/20 that were positive. pt first symptoms were 02/25/20; H/A, sneezing, sinus pressure,low grade fever, and prod cough with clear phlegm and runny nose. Pt was put on Augmentin 875-125 mg abx due to sinus pressure on 02/25/20 and that helped the H/A. No other symptoms at this time and fever stopped on 02/27/20.No SOB and no diarrhea.  Pt instructed to drink plenty of fluids, rest, tylenol for fever and self quarantine for 10 days from positive test result 02/28/20. UC & ED precautions given and pt voiced understanding. Pt scheduled video visit on 02/29/20 at 9:20 with Dr Ermalene Searing.

## 2020-02-29 ENCOUNTER — Encounter: Payer: Self-pay | Admitting: Family Medicine

## 2020-02-29 ENCOUNTER — Other Ambulatory Visit: Payer: Self-pay | Admitting: Family

## 2020-02-29 ENCOUNTER — Telehealth (HOSPITAL_COMMUNITY): Payer: Self-pay

## 2020-02-29 ENCOUNTER — Ambulatory Visit (INDEPENDENT_AMBULATORY_CARE_PROVIDER_SITE_OTHER): Payer: BC Managed Care – PPO | Admitting: Family Medicine

## 2020-02-29 VITALS — Ht 63.0 in | Wt 170.0 lb

## 2020-02-29 DIAGNOSIS — U071 COVID-19: Secondary | ICD-10-CM | POA: Diagnosis not present

## 2020-02-29 NOTE — Assessment & Plan Note (Signed)
°  No SOB.  No red flags/need for ER visit or in-person exam at respiratory clinic at this time..    Pt moderate risk for COVID complications given obesity.. referred for MAB infusion. If SOB begins symptoms worsening.. have low threshold for in-person exam, if severe shortness of breath ER visit recommended.  Can monitor Oxygen saturation at home with home monitor if able to obtain.  Go to ER if O2 sat < 90% on room air.  Reviewed home care and provided information through MyChart.  Recommended isolation until test returns. If returns positive 10 days quarantine recommended.  Provided info about prevention of spread of COVID 19.

## 2020-02-29 NOTE — Telephone Encounter (Signed)
Called to Discuss with patient about Covid symptoms and the use of the monoclonal antibody infusion for those with mild to moderate Covid symptoms and at a high risk of hospitalization.     Pt appears to qualify for this infusion due to co-morbid conditions and/or a member of an at-risk group in accordance with the FDA Emergency Use Authorization.    Pt stated her symptoms started on 11/8, tested [facility], and complains of cough, fever, and headache. QRF is BMI>25. Pt informed that an APP will call to verify information and set up an appointment.

## 2020-02-29 NOTE — Patient Instructions (Addendum)
Stop Augmentin as not likely on going sinus infection.  Start vit C, Vit D and zinc.     If SOB begins symptoms worsening.. have low threshold for in-person exam, if severe shortness of breath ER visit recommended.  Can monitor Oxygen saturation at home with home monitor if able to obtain.  Go to ER if O2 sat < 90% on room air.        Person Under Monitoring Name: Lynn Ortiz  Location: [address] [place] [address]   Infection Prevention Recommendations for Individuals Confirmed to have, or Being Evaluated for, 2019 Novel Coronavirus (COVID-19) Infection Who Receive Care at Home  Individuals who are confirmed to have, or are being evaluated for, COVID-19 should follow the prevention steps below until a healthcare provider or local or state health department says they can return to normal activities.  Stay home except to get medical care You should restrict activities outside your home, except for getting medical care. Do not go to work, school, or public areas, and do not use public transportation or taxis.  Call ahead before visiting your doctor Before your medical appointment, call the healthcare provider and tell them that you have, or are being evaluated for, COVID-19 infection. This will help the healthcare provider's office take steps to keep other people from getting infected. Ask your healthcare provider to call the local or state health department.  Monitor your symptoms Seek prompt medical attention if your illness is worsening (e.g., difficulty breathing). Before going to your medical appointment, call the healthcare provider and tell them that you have, or are being evaluated for, COVID-19 infection. Ask your healthcare provider to call the local or state health department.  Wear a facemask You should wear a facemask that covers your nose and mouth when you are in the same room with other people and when you visit a healthcare provider. People who  live with or visit you should also wear a facemask while they are in the same room with you.  Separate yourself from other people in your home As much as possible, you should stay in a different room from other people in your home. Also, you should use a separate bathroom, if available.  Avoid sharing household items You should not share dishes, drinking glasses, cups, eating utensils, towels, bedding, or other items with other people in your home. After using these items, you should wash them thoroughly with soap and water.  Cover your coughs and sneezes Cover your mouth and nose with a tissue when you cough or sneeze, or you can cough or sneeze into your sleeve. Throw used tissues in a lined trash can, and immediately wash your hands with soap and water for at least 20 seconds or use an alcohol-based hand rub.  Wash your [employer] your hands often and thoroughly with soap and water for at least 20 seconds. You can use an alcohol-based hand sanitizer if soap and water are not available and if your hands are not visibly dirty. Avoid touching your eyes, nose, and mouth with unwashed hands.   Prevention Steps for Caregivers and Household Members of Individuals Confirmed to have, or Being Evaluated for, COVID-19 Infection Being Cared for in the Home  If you live with, or provide care at home for, a person confirmed to have, or being evaluated for, COVID-19 infection please follow these guidelines to prevent infection:  Follow healthcare provider's instructions Make sure that you understand and can help the patient follow any healthcare  provider instructions for all care.  Provide for the patient's basic needs You should help the patient with basic needs in the home and provide support for getting groceries, prescriptions, and other personal needs.  Monitor the patient's symptoms If they are getting sicker, call his or her medical provider and tell them that the patient has, or is  being evaluated for, COVID-19 infection. This will help the healthcare provider's office take steps to keep other people from getting infected. Ask the healthcare provider to call the local or state health department.  Limit the number of people who have contact with the patient  If possible, have only one caregiver for the patient.  Other household members should stay in another home or place of residence. If this is not possible, they should stay  in another room, or be separated from the patient as much as possible. Use a separate bathroom, if available.  Restrict visitors who do not have an essential need to be in the home.  Keep older adults, very young children, and other sick people away from the patient Keep older adults, very young children, and those who have compromised immune systems or chronic health conditions away from the patient. This includes people with chronic heart, lung, or kidney conditions, diabetes, and cancer.  Ensure good ventilation Make sure that shared spaces in the home have good air flow, such as from an air conditioner or an opened window, weather permitting.  Wash your hands often  Wash your hands often and thoroughly with soap and water for at least 20 seconds. You can use an alcohol based hand sanitizer if soap and water are not available and if your hands are not visibly dirty.  Avoid touching your eyes, nose, and mouth with unwashed hands.  Use disposable paper towels to dry your hands. If not available, use dedicated cloth towels and replace them when they become wet.  Wear a facemask and gloves  Wear a disposable facemask at all times in the room and gloves when you touch or have contact with the patient's blood, body fluids, and/or secretions or excretions, such as sweat, saliva, sputum, nasal mucus, vomit, urine, or feces.  Ensure the mask fits over your nose and mouth tightly, and do not touch it during use.  Throw out disposable facemasks  and gloves after using them. Do not reuse.  Wash your hands immediately after removing your facemask and gloves.  If your personal clothing becomes contaminated, carefully remove clothing and launder. Wash your hands after handling contaminated clothing.  Place all used disposable facemasks, gloves, and other waste in a lined container before disposing them with other household waste.  Remove gloves and wash your hands immediately after handling these items.  Do not share dishes, glasses, or other household items with the patient  Avoid sharing household items. You should not share dishes, drinking glasses, cups, eating utensils, towels, bedding, or other items with a patient who is confirmed to have, or being evaluated for, COVID-19 infection.  After the person uses these items, you should wash them thoroughly with soap and water.  Wash laundry thoroughly  Immediately remove and wash clothes or bedding that have blood, body fluids, and/or secretions or excretions, such as sweat, saliva, sputum, nasal mucus, vomit, urine, or feces, on them.  Wear gloves when handling laundry from the patient.  Read and follow directions on labels of laundry or clothing items and detergent. In general, wash and dry with the warmest temperatures recommended on the label.  Clean all areas the individual has used often  Clean all touchable surfaces, such as counters, tabletops, doorknobs, bathroom fixtures, toilets, phones, keyboards, tablets, and bedside tables, every day. Also, clean any surfaces that may have blood, body fluids, and/or secretions or excretions on them.  Wear gloves when cleaning surfaces the patient has come in contact with.  Use a diluted bleach solution (e.g., dilute bleach with 1 part bleach and 10 parts water) or a household disinfectant with a label that says EPA-registered for coronaviruses. To make a bleach solution at home, add 1 tablespoon of bleach to 1 quart (4 cups) of water.  For a larger supply, add  cup of bleach to 1 gallon (16 cups) of water.  Read labels of cleaning products and follow recommendations provided on product labels. Labels contain instructions for safe and effective use of the cleaning product including precautions you should take when applying the product, such as wearing gloves or eye protection and making sure you have good ventilation during use of the product.  Remove gloves and wash hands immediately after cleaning.  Monitor yourself for signs and symptoms of illness Caregivers and household members are considered close contacts, should monitor their health, and will be asked to limit movement outside of the home to the extent possible. Follow the monitoring steps for close contacts listed on the symptom monitoring form.   ? If you have additional questions, contact your local health department or call the epidemiologist on call at [phone] (available 24/7). ? This guidance is subject to change. For the most up-to-date guidance from [facility], please refer to their website: [email or web address]

## 2020-02-29 NOTE — Progress Notes (Signed)
VIRTUAL VISIT Due to national recommendations of social distancing due to COVID 19, a virtual visit is felt to be most appropriate for this patient at this time.   I connected with the patient on 02/29/20 at  9:20 AM EST by virtual telehealth platform and verified that I am speaking with the correct person using two identifiers.   I discussed the limitations, risks, security and privacy concerns of performing an evaluation and management service by  virtual telehealth platform and the availability of in person appointments. I also discussed with the patient that there may be a patient responsible charge related to this service. The patient expressed understanding and agreed to proceed.  Patient location: Home Provider Location: [facility] Participants: Kerby Nora and Donnie Coffin Scannell   Chief Complaint  Patient presents with  . Covid Positive    wanted to know if she would qualify for infusion... Sx started 02/25/2020... rapid test negative[facility]  . Sinus Pressure    prescribed Augmentin on 02/25/2020... Sneezing... Does she need to keep taking Augmentin...  . Fever    100-101.Marland KitchenMarland Kitchenpt has tried OTC Tylenol  . Headache  . Cough    History of Present Illness:   49 year old obese female  ( BMI 30) presents with fever, headache and cough. Symptoms started 02/25/2020  She had a positive COVID test on 02/28/2020 Current symptoms included fever 100-101, fatigue, mildbody ache, dry cough, sneezing.  No loss of taste or smell.  No SOB and wheeze.  No N/V/D.   Able to keep up with fluids.   Using tylenol.  On zinc and Vit C.   Body mass index is 30.11 kg/m.   She had recently been seen and diagnosed with  sinus infection .. treated with Augmentin on 02/25/2020  COVID 19 screen No recent travel or known exposure to COVID19  The importance of social distancing was discussed today.   Review of Systems  Constitutional: Negative for chills and fever.  HENT:  Positive for congestion. Negative for ear pain.   Eyes: Negative for pain and redness.  Respiratory: Positive for cough. Negative for shortness of breath.   Cardiovascular: Negative for chest pain, palpitations and leg swelling.  Gastrointestinal: Negative for abdominal pain, blood in stool, constipation, diarrhea, nausea and vomiting.  Genitourinary: Negative for dysuria.  Musculoskeletal: Negative for falls and myalgias.  Skin: Negative for rash.  Neurological: Negative for dizziness.  Psychiatric/Behavioral: Negative for depression. The patient is not nervous/anxious.       Past Medical History:  Diagnosis Date  . Allergy    allerlgic rhinitis  . Anemia    iron deficiency    reports that she has never smoked. She has never used smokeless tobacco. She reports current alcohol use. She reports that she does not use drugs.   Current Outpatient Medications:  .  acetaminophen (TYLENOL) 500 MG tablet, Take 500 mg by mouth every 6 (six) hours as needed., Disp: , Rfl:  .  amoxicillin-clavulanate (AUGMENTIN) 875-125 MG tablet, Take 1 tablet by mouth 2 (two) times daily., Disp: , Rfl:  .  CALCIUM PO, Take 1 tablet by mouth daily., Disp: , Rfl:  .  melatonin 5 MG TABS, Take 5 mg by mouth at bedtime as needed., Disp: , Rfl:  .  Multiple Vitamin (MULTIVITAMIN) tablet, Take 1 tablet by mouth daily.  , Disp: , Rfl:  .  nystatin cream (MYCOSTATIN), Apply 1 application topically 2 (two) times daily., Disp: 30 g, Rfl: 1   Observations/Objective:  There were no vitals taken for this visit.  Physical Exam  Physical Exam Constitutional:      General: The patient is not in acute distress. Pulmonary:     Effort: Pulmonary effort is normal. No respiratory distress.  Neurological:     Mental Status: The patient is alert and oriented to person, place, and time.  Psychiatric:        Mood and Affect: Mood normal.        Behavior: Behavior normal.   Assessment and Plan   COVID-19 virus infection    No SOB.  No red flags/need for ER visit or in-person exam at respiratory clinic at this time..    Pt moderate risk for COVID complications given obesity.. referred for MAB infusion. If SOB begins symptoms worsening.. have low threshold for in-person exam, if severe shortness of breath ER visit recommended.  Can monitor Oxygen saturation at home with home monitor if able to obtain.  Go to ER if O2 sat < 90% on room air.  Reviewed home care and provided information through MyChart.  Recommended isolation until test returns. If returns positive 10 days quarantine recommended.  Provided info about prevention of spread of COVID 19.    I discussed the assessment and treatment plan with the patient. The patient was provided an opportunity to ask questions and all were answered. The patient agreed with the plan and demonstrated an understanding of the instructions.   The patient was advised to call back or seek an in-person evaluation if the symptoms worsen or if the condition fails to improve as anticipated.     Kerby Nora, MD

## 2020-02-29 NOTE — Progress Notes (Signed)
I connected by phone with Lynn Ortiz on 02/29/2020 at 5:01 PM to discuss the potential use of a new treatment for mild to moderate COVID-19 viral infection in non-hospitalized patients.  This patient is a 49 y.o. female that meets the FDA criteria for Emergency Use Authorization of COVID monoclonal antibody casirivimab/imdevimab, bamlanivimab/eteseviamb, or sotrovimab.  Has a (+) direct SARS-CoV-2 viral test result  Has mild or moderate COVID-19   Is NOT hospitalized due to COVID-19  Is within 10 days of symptom onset  Has at least one of the high risk factor(s) for progression to severe COVID-19 and/or hospitalization as defined in EUA.  Specific high risk criteria : BMI > 25   Symptoms of cough, fever, H/A began 02/25/20.   I have spoken and communicated the following to the patient or parent/caregiver regarding COVID monoclonal antibody treatment:  1. FDA has authorized the emergency use for the treatment of mild to moderate COVID-19 in adults and pediatric patients with positive results of direct SARS-CoV-2 viral testing who are 53 years of age and older weighing at least 40 kg, and who are at high risk for progressing to severe COVID-19 and/or hospitalization.  2. The significant known and potential risks and benefits of COVID monoclonal antibody, and the extent to which such potential risks and benefits are unknown.  3. Information on available alternative treatments and the risks and benefits of those alternatives, including clinical trials.  4. Patients treated with COVID monoclonal antibody should continue to self-isolate and use infection control measures (e.g., wear mask, isolate, social distance, avoid sharing personal items, clean and disinfect high touch surfaces, and frequent handwashing) according to CDC guidelines.   5. The patient or parent/caregiver has the option to accept or refuse COVID monoclonal antibody treatment.  After reviewing this information with  the patient, the patient has agreed to receive one of the available covid 19 monoclonal antibodies and will be provided an appropriate fact sheet prior to infusion. Morton Stall, NP 02/29/2020 5:01 PM

## 2020-03-01 ENCOUNTER — Ambulatory Visit (HOSPITAL_COMMUNITY)
Admission: RE | Admit: 2020-03-01 | Discharge: 2020-03-01 | Disposition: A | Discharge status: Home / Self Care | Payer: BC Managed Care – PPO | Source: Ambulatory Visit | Attending: Pulmonary Disease | Admitting: Pulmonary Disease

## 2020-03-01 DIAGNOSIS — U071 COVID-19: Secondary | ICD-10-CM | POA: Insufficient documentation

## 2020-03-01 MED ORDER — METHYLPREDNISOLONE SODIUM SUCC 125 MG IJ SOLR: 125.0000 mg | Freq: Once | INTRAMUSCULAR | Status: DC | PRN

## 2020-03-01 MED ORDER — DIPHENHYDRAMINE HCL 50 MG/ML IJ SOLN: 50.0000 mg | Freq: Once | INTRAMUSCULAR | Status: DC | PRN

## 2020-03-01 MED ORDER — ALBUTEROL SULFATE HFA 108 (90 BASE) MCG/ACT IN AERS: 2.0000 | INHALATION_SPRAY | Freq: Once | RESPIRATORY_TRACT | Status: DC | PRN

## 2020-03-01 MED ORDER — FAMOTIDINE IN NACL 20-0.9 MG/50ML-% IV SOLN: 20.0000 mg | Freq: Once | INTRAVENOUS | Status: DC | PRN

## 2020-03-01 MED ORDER — SOTROVIMAB 500 MG/8ML IV SOLN
500.0000 mg | Freq: Once | INTRAVENOUS | Status: AC
  Administered 2020-03-01: 500 mg via INTRAVENOUS

## 2020-03-01 MED ORDER — EPINEPHRINE 0.3 MG/0.3ML IJ SOAJ: 0.3000 mg | Freq: Once | INTRAMUSCULAR | Status: DC | PRN

## 2020-03-01 MED ORDER — SODIUM CHLORIDE 0.9 % IV SOLN: INTRAVENOUS | Status: DC | PRN

## 2020-03-01 NOTE — Progress Notes (Signed)
Diagnosis: COVID-19  Physician: Dr. Shan Levans  Procedure: Allergies reviewed.  Sotrovimab administered via IV infusion after med fact sheet provided to patient and all questions answered. Discharge instructions provided to patient; all questions answered.   Complications: No immediate complications noted.  Discharge: Discharged home   Gregary Signs 03/01/2020

## 2020-03-01 NOTE — Discharge Instructions (Signed)
10 Things You Can Do to Manage Your COVID-19 Symptoms at Home If you have possible or confirmed COVID-19: 1. Stay home from work and school. And stay away from other public places. If you must go out, avoid using any kind of public transportation, ridesharing, or taxis. 2. Monitor your symptoms carefully. If your symptoms get worse, call your healthcare provider immediately. 3. Get rest and stay hydrated. 4. If you have a medical appointment, call the healthcare provider ahead of time and tell them that you have or may have COVID-19. 5. For medical emergencies, call 911 and notify the dispatch personnel that you have or may have COVID-19. 6. Cover your cough and sneezes with a tissue or use the inside of your elbow. 7. Wash your hands often with soap and water for at least 20 seconds or clean your hands with an alcohol-based hand sanitizer that contains at least 60% alcohol. 8. As much as possible, stay in a specific room and away from other people in your home. Also, you should use a separate bathroom, if available. If you need to be around other people in or outside of the home, wear a mask. 9. Avoid sharing personal items with other people in your household, like dishes, towels, and bedding. 10. Clean all surfaces that are touched often, like counters, tabletops, and doorknobs. Use household cleaning sprays or wipes according to the label instructions. cdc.gov/coronavirus 10/18/2018 This information is not intended to replace advice given to you by your health care provider. Make sure you discuss any questions you have with your health care provider. Document Revised: 03/22/2019 Document Reviewed: 03/22/2019 Elsevier Patient Education  2020 [employer]   COVID-19 COVID-19 is a respiratory infection that is caused by a virus called severe acute respiratory syndrome coronavirus 2 (SARS-CoV-2). The disease is also known as coronavirus disease or novel coronavirus. In some people, the virus may  not cause any symptoms. In others, it may cause a serious infection. The infection can get worse quickly and can lead to complications, such as:  Pneumonia, or infection of the lungs.  Acute respiratory distress syndrome or ARDS. This is a condition in which fluid build-up in the lungs prevents the lungs from filling with air and passing oxygen into the blood.  Acute respiratory failure. This is a condition in which there is not enough oxygen passing from the lungs to the body or when carbon dioxide is not passing from the lungs out of the body.  Sepsis or septic shock. This is a serious bodily reaction to an infection.  Blood clotting problems.  Secondary infections due to bacteria or fungus.  Organ failure. This is when your body's organs stop working. The virus that causes COVID-19 is contagious. This means that it can spread from person to person through droplets from coughs and sneezes (respiratory secretions). What are the causes? This illness is caused by a virus. You may catch the virus by:  Breathing in droplets from an infected person. Droplets can be spread by a person breathing, speaking, singing, coughing, or sneezing.  Touching something, like a table or a doorknob, that was exposed to the virus (contaminated) and then touching your mouth, nose, or eyes. What increases the risk? Risk for infection You are more likely to be infected with this virus if you:  Are within 6 feet (2 meters) of a person with COVID-19.  Provide care for or live with a person who is infected with COVID-19.  Spend time in crowded indoor spaces or   live in shared housing. Risk for serious illness You are more likely to become seriously ill from the virus if you:  Are 50 years of age or older. The higher your age, the more you are at risk for serious illness.  Live in a nursing home or long-term care facility.  Have cancer.  Have a long-term (chronic) disease such as: ? Chronic lung disease,  including chronic obstructive pulmonary disease or asthma. ? A long-term disease that lowers your body's ability to fight infection (immunocompromised). ? Heart disease, including heart failure, a condition in which the arteries that lead to the heart become narrow or blocked (coronary artery disease), a disease which makes the heart muscle thick, weak, or stiff (cardiomyopathy). ? Diabetes. ? Chronic kidney disease. ? Sickle cell disease, a condition in which red blood cells have an abnormal "sickle" shape. ? Liver disease.  Are obese. What are the signs or symptoms? Symptoms of this condition can range from mild to severe. Symptoms may appear any time from 2 to 14 days after being exposed to the virus. They include:  A fever or chills.  A cough.  Difficulty breathing.  Headaches, body aches, or muscle aches.  Runny or stuffy (congested) nose.  A sore throat.  New loss of taste or smell. Some people may also have stomach problems, such as nausea, vomiting, or diarrhea. Other people may not have any symptoms of COVID-19. How is this diagnosed? This condition may be diagnosed based on:  Your signs and symptoms, especially if: ? You live in an area with a COVID-19 outbreak. ? You recently traveled to or from an area where the virus is common. ? You provide care for or live with a person who was diagnosed with COVID-19. ? You were exposed to a person who was diagnosed with COVID-19.  A physical exam.  Lab tests, which may include: ? Taking a sample of fluid from the back of your nose and throat (nasopharyngeal fluid), your nose, or your throat using a swab. ? A sample of mucus from your lungs (sputum). ? Blood tests.  Imaging tests, which may include, X-rays, CT scan, or ultrasound. How is this treated? At present, there is no medicine to treat COVID-19. Medicines that treat other diseases are being used on a trial basis to see if they are effective against COVID-19. Your  health care provider will talk with you about ways to treat your symptoms. For most people, the infection is mild and can be managed at home with rest, fluids, and over-the-counter medicines. Treatment for a serious infection usually takes places in a hospital intensive care unit (ICU). It may include one or more of the following treatments. These treatments are given until your symptoms improve.  Receiving fluids and medicines through an IV.  Supplemental oxygen. Extra oxygen is given through a tube in the nose, a face mask, or a hood.  Positioning you to lie on your stomach (prone position). This makes it easier for oxygen to get into the lungs.  Continuous positive airway pressure (CPAP) or bi-level positive airway pressure (BPAP) machine. This treatment uses mild air pressure to keep the airways open. A tube that is connected to a motor delivers oxygen to the body.  Ventilator. This treatment moves air into and out of the lungs by using a tube that is placed in your windpipe.  Tracheostomy. This is a procedure to create a hole in the neck so that a breathing tube can be inserted.  Extracorporeal membrane   oxygenation (ECMO). This procedure gives the lungs a chance to recover by taking over the functions of the heart and lungs. It supplies oxygen to the body and removes carbon dioxide. Follow these instructions at home: Lifestyle  If you are sick, stay home except to get medical care. Your health care provider will tell you how long to stay home. Call your health care provider before you go for medical care.  Rest at home as told by your health care provider.  Do not use any products that contain nicotine or tobacco, such as cigarettes, e-cigarettes, and chewing tobacco. If you need help quitting, ask your health care provider.  Return to your normal activities as told by your health care provider. Ask your health care provider what activities are safe for you. General  instructions  Take over-the-counter and prescription medicines only as told by your health care provider.  Drink enough fluid to keep your urine pale yellow.  Keep all follow-up visits as told by your health care provider. This is important. How is this prevented?  There is no vaccine to help prevent COVID-19 infection. However, there are steps you can take to protect yourself and others from this virus. To protect yourself:   Do not travel to areas where COVID-19 is a risk. The areas where COVID-19 is reported change often. To identify high-risk areas and travel restrictions, check the CDC travel website: wwwnc.cdc.gov/travel/notices  If you live in, or must travel to, an area where COVID-19 is a risk, take precautions to avoid infection. ? Stay away from people who are sick. ? Wash your hands often with soap and water for 20 seconds. If soap and water are not available, use an alcohol-based hand sanitizer. ? Avoid touching your mouth, face, eyes, or nose. ? Avoid going out in public, follow guidance from your state and local health authorities. ? If you must go out in public, wear a cloth face covering or face mask. Make sure your mask covers your nose and mouth. ? Avoid crowded indoor spaces. Stay at least 6 feet (2 meters) away from others. ? Disinfect objects and surfaces that are frequently touched every day. This may include:  Counters and tables.  Doorknobs and light switches.  Sinks and faucets.  Electronics, such as phones, remote controls, keyboards, computers, and tablets. To protect others: If you have symptoms of COVID-19, take steps to prevent the virus from spreading to others.  If you think you have a COVID-19 infection, contact your health care provider right away. Tell your health care team that you think you may have a COVID-19 infection.  Stay home. Leave your house only to seek medical care. Do not use public transport.  Do not travel while you are  sick.  Wash your hands often with soap and water for 20 seconds. If soap and water are not available, use alcohol-based hand sanitizer.  Stay away from other members of your household. Let healthy household members care for children and pets, if possible. If you have to care for children or pets, wash your hands often and wear a mask. If possible, stay in your own room, separate from others. Use a different bathroom.  Make sure that all people in your household wash their hands well and often.  Cough or sneeze into a tissue or your sleeve or elbow. Do not cough or sneeze into your hand or into the air.  Wear a cloth face covering or face mask. Make sure your mask covers your nose   and mouth. Where to find more information  Centers for Disease Control and Prevention: www.cdc.gov/coronavirus/2019-ncov/index.html  World Health Organization: www.who.int/health-topics/coronavirus Contact a health care provider if:  You live in or have traveled to an area where COVID-19 is a risk and you have symptoms of the infection.  You have had contact with someone who has COVID-19 and you have symptoms of the infection. Get help right away if:  You have trouble breathing.  You have pain or pressure in your chest.  You have confusion.  You have bluish lips and fingernails.  You have difficulty waking from sleep.  You have symptoms that get worse. These symptoms may represent a serious problem that is an emergency. Do not wait to see if the symptoms will go away. Get medical help right away. Call your local emergency services (911 in the [place] Do not drive yourself to the hospital. Let the emergency medical personnel know if you think you have COVID-19. Summary  COVID-19 is a respiratory infection that is caused by a virus. It is also known as coronavirus disease or novel coronavirus. It can cause serious infections, such as pneumonia, acute respiratory distress syndrome, acute respiratory failure,  or sepsis.  The virus that causes COVID-19 is contagious. This means that it can spread from person to person through droplets from breathing, speaking, singing, coughing, or sneezing.  You are more likely to develop a serious illness if you are 50 years of age or older, have a weak immune system, live in a nursing home, or have chronic disease.  There is no medicine to treat COVID-19. Your health care provider will talk with you about ways to treat your symptoms.  Take steps to protect yourself and others from infection. Wash your hands often and disinfect objects and surfaces that are frequently touched every day. Stay away from people who are sick and wear a mask if you are sick. This information is not intended to replace advice given to you by your health care provider. Make sure you discuss any questions you have with your health care provider. Document Revised: 02/02/2019 Document Reviewed: 05/11/2018 Elsevier Patient Education  2020 [employer]  What types of side effects do monoclonal antibody drugs cause?  Common side effects  In general, the more common side effects caused by monoclonal antibody drugs include: . Allergic reactions, such as hives or itching . Flu-like signs and symptoms, including chills, fatigue, fever, and muscle aches and pains . Nausea, vomiting . Diarrhea . Skin rashes . Low blood pressure   The CDC is recommending patients who receive monoclonal antibody treatments wait at least 90 days before being vaccinated.  Currently, there are no data on the safety and efficacy of mRNA COVID-19 vaccines in persons who received monoclonal antibodies or convalescent plasma as part of COVID-19 treatment. Based on the estimated half-life of such therapies as well as evidence suggesting that reinfection is uncommon in the 90 days after initial infection, vaccination should be deferred for at least 90 days, as a precautionary measure until additional information becomes  available, to avoid interference of the antibody treatment with vaccine-induced immune responses.  

## 2020-03-04 ENCOUNTER — Ambulatory Visit: Payer: BC Managed Care – PPO | Admitting: Family Medicine

## 2020-06-17 DIAGNOSIS — Z23 Encounter for immunization: Secondary | ICD-10-CM | POA: Diagnosis not present

## 2020-11-18 DIAGNOSIS — R0981 Nasal congestion: Secondary | ICD-10-CM | POA: Diagnosis not present

## 2021-02-12 ENCOUNTER — Other Ambulatory Visit (HOSPITAL_COMMUNITY)
Admission: RE | Admit: 2021-02-12 | Discharge: 2021-02-12 | Disposition: A | Discharge status: Home / Self Care | Payer: BC Managed Care – PPO | Source: Ambulatory Visit | Attending: Family Medicine | Admitting: Family Medicine

## 2021-02-12 ENCOUNTER — Ambulatory Visit (INDEPENDENT_AMBULATORY_CARE_PROVIDER_SITE_OTHER): Payer: BC Managed Care – PPO | Admitting: Family Medicine

## 2021-02-12 ENCOUNTER — Other Ambulatory Visit: Payer: Self-pay

## 2021-02-12 ENCOUNTER — Encounter: Payer: Self-pay | Admitting: Family Medicine

## 2021-02-12 VITALS — BP 102/68 | HR 83 | Temp 97.4°F | Ht 63.25 in | Wt 169.0 lb

## 2021-02-12 DIAGNOSIS — Z124 Encounter for screening for malignant neoplasm of cervix: Secondary | ICD-10-CM | POA: Insufficient documentation

## 2021-02-12 DIAGNOSIS — D509 Iron deficiency anemia, unspecified: Secondary | ICD-10-CM

## 2021-02-12 DIAGNOSIS — F331 Major depressive disorder, recurrent, moderate: Secondary | ICD-10-CM | POA: Diagnosis not present

## 2021-02-12 DIAGNOSIS — Z13220 Encounter for screening for lipoid disorders: Secondary | ICD-10-CM

## 2021-02-12 DIAGNOSIS — Z1211 Encounter for screening for malignant neoplasm of colon: Secondary | ICD-10-CM

## 2021-02-12 DIAGNOSIS — R5383 Other fatigue: Secondary | ICD-10-CM

## 2021-02-12 DIAGNOSIS — Z0000 Encounter for general adult medical examination without abnormal findings: Secondary | ICD-10-CM

## 2021-02-12 DIAGNOSIS — M25562 Pain in left knee: Secondary | ICD-10-CM

## 2021-02-12 MED ORDER — BUPROPION HCL ER (XL) 150 MG PO TB24: 150.0000 mg | ORAL_TABLET | Freq: Every day | ORAL | 3 refills | Status: DC

## 2021-02-12 MED ORDER — DICLOFENAC SODIUM 75 MG PO TBEC: 75.0000 mg | DELAYED_RELEASE_TABLET | Freq: Two times a day (BID) | ORAL | 0 refills | Status: DC

## 2021-02-12 NOTE — Progress Notes (Signed)
Patient ID: Lynn Ortiz, female    DOB: 1971/03/13, 50 y.o.   MRN: [id number]  This visit was conducted in person.  BP 102/68   Pulse 83   Temp (!) 97.4 F (36.3 C) (Temporal)   Ht 5' 3.25" (1.607 m)   Wt 169 lb (76.7 kg)   SpO2 97%   BMI 29.70 kg/m    CC: Chief Complaint  Patient presents with   Annual Exam    Subjective:   HPI: Lynn Ortiz is a 50 y.o. female presenting on 02/12/2021 for Annual Exam    She has been under more stress in the last 3 years.  She is tired all the time.  Sleeps well at night.  MDD/GAD: She has had worsening in her mood.   PhQ9: 10, GAD7:8  Venlafaxine helped in past but casue severe nightmares.  She is exercising occ.     She has been having issues with left knee pain, limiting exercise. Ongoing x 1 year, worse in last few months.  Has some  shooting pain with activity, aching at rest.  Worse going up the stair.  No swelling, no redness.  No know injury, no fall.  No giving out, no popping or clicking.  She has tried glucosamine, not consistently.  Occ using ibuprofen 400 mg daily. No SE.       Relevant past medical, surgical, family and social history reviewed and updated as indicated. Interim medical history since our last visit reviewed. Allergies and medications reviewed and updated. Outpatient Medications Prior to Visit  Medication Sig Dispense Refill   acetaminophen (TYLENOL) 500 MG tablet Take 500 mg by mouth every 6 (six) hours as needed.     CALCIUM PO Take 1 tablet by mouth daily.     melatonin 5 MG TABS Take 5 mg by mouth at bedtime as needed.     Multiple Vitamin (MULTIVITAMIN) tablet Take 1 tablet by mouth daily.       nystatin cream (MYCOSTATIN) Apply 1 application topically 2 (two) times daily. 30 g 1   amoxicillin-clavulanate (AUGMENTIN) 875-125 MG tablet Take 1 tablet by mouth 2 (two) times daily.     No facility-administered medications prior to visit.     Per HPI unless specifically indicated  in ROS section below Review of Systems  Constitutional:  Negative for fatigue and fever.  HENT:  Negative for congestion.   Eyes:  Negative for pain.  Respiratory:  Negative for cough and shortness of breath.   Cardiovascular:  Negative for chest pain, palpitations and leg swelling.  Gastrointestinal:  Negative for abdominal pain.  Genitourinary:  Negative for dysuria and vaginal bleeding.  Musculoskeletal:  Negative for back pain.  Neurological:  Negative for syncope, light-headedness and headaches.  Psychiatric/Behavioral:  Negative for dysphoric mood.   Objective:  BP 102/68   Pulse 83   Temp (!) 97.4 F (36.3 C) (Temporal)   Ht 5' 3.25" (1.607 m)   Wt 169 lb (76.7 kg)   SpO2 97%   BMI 29.70 kg/m   Wt Readings from Last 3 Encounters:  02/12/21 169 lb (76.7 kg)  02/29/20 170 lb (77.1 kg)  11/14/19 174 lb 8 oz (79.2 kg)      Physical Exam Vitals and nursing note reviewed.  Constitutional:      General: She is not in acute distress.    Appearance: Normal appearance. She is well-developed. She is not ill-appearing or toxic-appearing.  HENT:     Head: Normocephalic.  Right Ear: Hearing, tympanic membrane, ear canal and external ear normal.     Left Ear: Hearing, tympanic membrane, ear canal and external ear normal.     Nose: Nose normal.  Eyes:     General: Lids are normal. Lids are everted, no foreign bodies appreciated.     Conjunctiva/sclera: Conjunctivae normal.     Pupils: Pupils are equal, round, and reactive to light.  Neck:     Thyroid: No thyroid mass or thyromegaly.     Vascular: No carotid bruit.     Trachea: Trachea normal.  Cardiovascular:     Rate and Rhythm: Normal rate and regular rhythm.     Heart sounds: Normal heart sounds, S1 normal and S2 normal. No murmur heard.   No gallop.  Pulmonary:     Effort: Pulmonary effort is normal. No respiratory distress.     Breath sounds: Normal breath sounds. No wheezing, rhonchi or rales.  Abdominal:      General: Bowel sounds are normal. There is no distension or abdominal bruit.     Palpations: Abdomen is soft. There is no fluid wave or mass.     Tenderness: There is no abdominal tenderness. There is no guarding or rebound.     Hernia: No hernia is present.  Musculoskeletal:     Cervical back: Normal range of motion and neck supple.  Lymphadenopathy:     Cervical: No cervical adenopathy.  Skin:    General: Skin is warm and dry.     Findings: No rash.  Neurological:     Mental Status: She is alert.     Cranial Nerves: No cranial nerve deficit.     Sensory: No sensory deficit.  Psychiatric:        Mood and Affect: Mood is not anxious or depressed.        Speech: Speech normal.        Behavior: Behavior normal. Behavior is cooperative.        Judgment: Judgment normal.      Results for orders placed or performed in visit on 11/22/19  Hepatitis C antibody  Result Value Ref Range   Hepatitis C Ab NON-REACTIVE NON-REACTI   SIGNAL TO CUT-OFF 0.01 <1.00  VITAMIN D 25 Hydroxy (Vit-D Deficiency, Fractures)  Result Value Ref Range   VITD 31.59 30.00 - 100.00 ng/mL  Vitamin B12  Result Value Ref Range   Vitamin B-12 555 211 - 911 pg/mL  TSH  Result Value Ref Range   TSH 1.85 0.35 - 4.50 uIU/mL  Comprehensive metabolic panel  Result Value Ref Range   Sodium 139 135 - 145 mEq/L   Potassium 3.5 3.5 - 5.1 mEq/L   Chloride 104 96 - 112 mEq/L   CO2 28 19 - 32 mEq/L   Glucose, Bld 87 70 - 99 mg/dL   BUN 11 6 - 23 mg/dL   Creatinine, Ser 2.44 0.40 - 1.20 mg/dL   Total Bilirubin 0.7 0.2 - 1.2 mg/dL   Alkaline Phosphatase 76 39 - 117 U/L   AST 27 0 - 37 U/L   ALT 41 (H) 0 - 35 U/L   Total Protein 6.7 6.0 - 8.3 g/dL   Albumin 4.3 3.5 - 5.2 g/dL   GFR 01.02 >72.53 mL/min   Calcium 9.2 8.4 - 10.5 mg/dL  Lipid panel  Result Value Ref Range   Cholesterol 202 (H) 0 - 200 mg/dL   Triglycerides 664.4 (H) 0.0 - 149.0 mg/dL   HDL 03.47 >42.59 mg/dL  VLDL 32.2 0.0 - 40.0 mg/dL   LDL  Cholesterol 540 (H) 0 - 99 mg/dL   Total CHOL/HDL Ratio 4    NonHDL 148.79   Hemoglobin A1c  Result Value Ref Range   Hgb A1c MFr Bld 4.6 4.6 - 6.5 %  IBC + Ferritin  Result Value Ref Range   Iron 130 42 - 145 ug/dL   Transferrin 086.7 619.5 - 360.0 mg/dL   Saturation Ratios 09.3 20.0 - 50.0 %   Ferritin 42.7 10.0 - 291.0 ng/mL  CBC with Differential/Platelet  Result Value Ref Range   WBC 5.1 4.0 - 10.5 K/uL   RBC 3.86 (L) 3.87 - 5.11 Mil/uL   Hemoglobin 12.9 12.0 - 15.0 g/dL   HCT 26.7 12.4 - 58.0 %   MCV 94.7 78.0 - 100.0 fl   MCHC 35.4 30.0 - 36.0 g/dL   RDW 99.8 33.8 - 25.0 %   Platelets 195.0 150.0 - 400.0 K/uL   Neutrophils Relative % 50.2 43.0 - 77.0 %   Lymphocytes Relative 37.3 12.0 - 46.0 %   Monocytes Relative 8.6 3.0 - 12.0 %   Eosinophils Relative 3.4 0.0 - 5.0 %   Basophils Relative 0.5 0.0 - 3.0 %   Neutro Abs 2.5 1.4 - 7.7 K/uL   Lymphs Abs 1.9 0.7 - 4.0 K/uL   Monocytes Absolute 0.4 0.1 - 1.0 K/uL   Eosinophils Absolute 0.2 0.0 - 0.7 K/uL   Basophils Absolute 0.0 0.0 - 0.1 K/uL    This visit occurred during the SARS-CoV-2 public health emergency.  Safety protocols were in place, including screening questions prior to the visit, additional usage of staff PPE, and extensive cleaning of exam room while observing appropriate contact time as indicated for disinfecting solutions.   COVID 19 screen:  No recent travel or known exposure to COVID19 The patient denies respiratory symptoms of COVID 19 at this time. The importance of social distancing was discussed today.   Assessment and Plan The patient's preventative maintenance and recommended screening tests for an annual wellness exam were reviewed in full today. Brought up to date unless services declined.  Counselled on the importance of diet, exercise, and its role in overall health and mortality. The patient's FH and SH was reviewed, including their home life, tobacco status, and drug and alcohol status.    Mammo:last nml 12/2018, repeat due  PAP/DVE:10/2015, due in 2022  Vaccines: Td uptodate, COVID19 vaccine x 2, will get flu and COVID booster at her office.  Colon: no colon cancer early age in family... she would like to wait until age 32. Nonsmoker  STD testing:refused Hep C: done  .[email or web address] Meds ordered this encounter  Medications   DISCONTD: buPROPion (WELLBUTRIN XL) 150 MG 24 hr tablet    Sig: Take 1 tablet (150 mg total) by mouth daily.    Dispense:  30 tablet    Refill:  3   diclofenac (VOLTAREN) 75 MG EC tablet    Sig: Take 1 tablet (75 mg total) by mouth 2 (two) times daily.    Dispense:  30 tablet    Refill:  0   buPROPion (WELLBUTRIN XL) 150 MG 24 hr tablet    Sig: Take 1 tablet (150 mg total) by mouth daily.    Dispense:  30 tablet    Refill:  3    Orders Placed This Encounter  Procedures   Fecal occult blood, imunochemical    Standing Status:   Future    Standing Expiration Date:  02/12/2022   Lipid panel    Standing Status:   Future    Standing Expiration Date:   02/12/2022   Comprehensive metabolic panel    Standing Status:   Future    Standing Expiration Date:   02/12/2022   Vitamin B12    Standing Status:   Future    Standing Expiration Date:   02/12/2022   CBC with Differential/Platelet    Standing Status:   Future    Standing Expiration Date:   02/12/2022   TSH    Standing Status:   Future    Standing Expiration Date:   02/12/2022   VITAMIN D 25 Hydroxy (Vit-D Deficiency, Fractures)    Standing Status:   Future    Standing Expiration Date:   02/12/2022    Kerby Nora, MD

## 2021-02-12 NOTE — Patient Instructions (Addendum)
Return for fasting labs when able.  Start Wellbutrin daily.  Return stool  kit for colon cancer screening.  Please call the location of your choice from the menu below to schedule your Mammogram and/or Bone Density appointment.    [facility]                      Phone:  [phone], [place] [address]                                                             Services: Traditional and 3D Mammogram, [place] [facility]                 Phone: [phone] [address], [place] [address]    Service: Bone Density ONLY   *this site does NOT perform mammograms  [facility]                        Phone:  [phone] [address], [place] [address]                                            Services:  3D Mammogram and [place] at [facility]   Phone:  [phone]   [place], [place] [address]                                            Services: 3D Mammogram and [place]  [place] at [facility])  Phone:  [phone]   [address]  [place], [place] [address]                                              Services:  3D Mammogram and Bone Density  

## 2021-02-13 LAB — CYTOLOGY - PAP
Comment: NEGATIVE
Diagnosis: NEGATIVE
High risk HPV: NEGATIVE

## 2021-03-05 DIAGNOSIS — M25562 Pain in left knee: Secondary | ICD-10-CM | POA: Insufficient documentation

## 2021-03-05 NOTE — Assessment & Plan Note (Signed)
Start wellbutrin, follow up in 4 weeks for re-eval.

## 2021-03-05 NOTE — Assessment & Plan Note (Signed)
Treat with NSAID.Marland Kitchen diclofenac BID.

## 2021-03-06 ENCOUNTER — Other Ambulatory Visit: Payer: Self-pay | Admitting: Family Medicine

## 2021-03-17 ENCOUNTER — Encounter: Payer: Self-pay | Admitting: Family Medicine

## 2021-03-17 ENCOUNTER — Other Ambulatory Visit: Payer: Self-pay

## 2021-03-17 ENCOUNTER — Telehealth (INDEPENDENT_AMBULATORY_CARE_PROVIDER_SITE_OTHER): Payer: BC Managed Care – PPO | Admitting: Family Medicine

## 2021-03-17 VITALS — Ht 63.25 in

## 2021-03-17 DIAGNOSIS — F5104 Psychophysiologic insomnia: Secondary | ICD-10-CM | POA: Diagnosis not present

## 2021-03-17 DIAGNOSIS — F331 Major depressive disorder, recurrent, moderate: Secondary | ICD-10-CM | POA: Diagnosis not present

## 2021-03-17 MED ORDER — BUPROPION HCL ER (XL) 300 MG PO TB24
300.0000 mg | ORAL_TABLET | Freq: Every day | ORAL | 5 refills | Status: DC
Start: 2021-03-17 — End: 2021-05-18

## 2021-03-17 NOTE — Assessment & Plan Note (Signed)
Chronic, minimal improvement in 4 week but tolerating low dose Wellbutrin well.  will try trial of increased dose of Wellbutrin to 300 mg daily.  Re-eval in follow up in 4 weeks.

## 2021-03-17 NOTE — Assessment & Plan Note (Signed)
Has improved with wellbutrin.

## 2021-03-17 NOTE — Progress Notes (Signed)
VIRTUAL VISIT Due to national recommendations of social distancing due to COVID 19, a virtual visit is felt to be most appropriate for this patient at this time.   I connected with the patient on 03/17/21 at  4:00 PM EST by virtual telehealth platform and verified that I am speaking with the correct person using two identifiers.   I discussed the limitations, risks, security and privacy concerns of performing an evaluation and management service by  virtual telehealth platform and the availability of in person appointments. I also discussed with the patient that there may be a patient responsible charge related to this service. The patient expressed understanding and agreed to proceed.  Patient location: Home Provider Location: [facility] Participants: Lynn Ortiz and Lynn Ortiz   Chief Complaint  Patient presents with   Follow-up    MDD    History of Present Illness:  50 year old female present for follow up MDD.   Reviewed note from 02/12/2021 She was started on Wellbutrin 150 Xl daily.  NO SE., no jitteriness, no nightmares, no insomnia  She is sleeping much better overall.  It may be giving her more energy.. helped you get out walking.  Venlafaxine caused SE.  PHQ9 SCORE ONLY 03/17/2021 02/12/2021 11/14/2019  PHQ-9 Total Score 8 10 0   GAD 7 : Generalized Anxiety Score 02/12/2021  Nervous, Anxious, on Edge 2  Control/stop worrying 2  Worry too much - different things 2  Trouble relaxing 0  Restless 0  Easily annoyed or irritable 1  Afraid - awful might happen 1  Total GAD 7 Score 8  Anxiety Difficulty Somewhat difficult       COVID 19 screen No recent travel or known exposure to COVID19 The patient denies respiratory symptoms of COVID 19 at this time.  The importance of social distancing was discussed today.   Review of Systems  Constitutional:  Negative for chills and fever.  HENT:  Negative for congestion and ear pain.   Eyes:  Negative for pain  and redness.  Respiratory:  Negative for cough and shortness of breath.   Cardiovascular:  Negative for chest pain, palpitations and leg swelling.  Gastrointestinal:  Negative for abdominal pain, blood in stool, constipation, diarrhea, nausea and vomiting.  Genitourinary:  Negative for dysuria.  Musculoskeletal:  Negative for falls and myalgias.  Skin:  Negative for rash.  Neurological:  Negative for dizziness.  Psychiatric/Behavioral:  Positive for depression and hallucinations. Negative for substance abuse and suicidal ideas. The patient is not nervous/anxious and does not have insomnia.      Past Medical History:  Diagnosis Date   Allergy    allerlgic rhinitis   Anemia    iron deficiency    reports that she has never smoked. She has never used smokeless tobacco. She reports current alcohol use. She reports that she does not use drugs.   Current Outpatient Medications:    acetaminophen (TYLENOL) 500 MG tablet, Take 500 mg by mouth every 6 (six) hours as needed., Disp: , Rfl:    buPROPion (WELLBUTRIN XL) 150 MG 24 hr tablet, TAKE 1 TABLET BY MOUTH EVERY DAY, Disp: 90 tablet, Rfl: 0   CALCIUM PO, Take 1 tablet by mouth daily., Disp: , Rfl:    diclofenac (VOLTAREN) 75 MG EC tablet, Take 1 tablet (75 mg total) by mouth 2 (two) times daily., Disp: 30 tablet, Rfl: 0   melatonin 5 MG TABS, Take 5 mg by mouth at bedtime as needed., Disp: ,  Rfl:    Multiple Vitamin (MULTIVITAMIN) tablet, Take 1 tablet by mouth daily.  , Disp: , Rfl:    Observations/Objective: Height 5' 3.25" (1.607 m), last menstrual period 02/10/2021.  Physical Exam  Physical Exam Constitutional:      General: The patient is not in acute distress. Pulmonary:     Effort: Pulmonary effort is normal. No respiratory distress.  Neurological:     Mental Status: The patient is alert and oriented to person, place, and time.  Psychiatric:        Mood and Affect: Mood normal.        Behavior: Behavior normal.   Assessment  and Plan Problem List Items Addressed This Visit     Chronic insomnia    Has improved with wellbutrin.      Depression, major, recurrent, moderate (HCC) - Primary    Chronic, minimal improvement in 4 week but tolerating low dose Wellbutrin well.  will try trial of increased dose of Wellbutrin to 300 mg daily.  Re-eval in follow up in 4 weeks.      Relevant Medications   buPROPion (WELLBUTRIN XL) 300 MG 24 hr tablet   Meds ordered this encounter  Medications   buPROPion (WELLBUTRIN XL) 300 MG 24 hr tablet    Sig: Take 1 tablet (300 mg total) by mouth daily.    Dispense:  30 tablet    Refill:  5      I discussed the assessment and treatment plan with the patient. The patient was provided an opportunity to ask questions and all were answered. The patient agreed with the plan and demonstrated an understanding of the instructions.   The patient was advised to call back or seek an in-person evaluation if the symptoms worsen or if the condition fails to improve as anticipated.     Lynn Nora, MD

## 2021-03-25 ENCOUNTER — Other Ambulatory Visit (INDEPENDENT_AMBULATORY_CARE_PROVIDER_SITE_OTHER): Payer: BC Managed Care – PPO

## 2021-03-25 ENCOUNTER — Other Ambulatory Visit: Payer: Self-pay

## 2021-03-25 DIAGNOSIS — Z13220 Encounter for screening for lipoid disorders: Secondary | ICD-10-CM

## 2021-03-25 DIAGNOSIS — D509 Iron deficiency anemia, unspecified: Secondary | ICD-10-CM | POA: Diagnosis not present

## 2021-03-25 DIAGNOSIS — R5383 Other fatigue: Secondary | ICD-10-CM | POA: Diagnosis not present

## 2021-03-25 LAB — COMPREHENSIVE METABOLIC PANEL
ALT: 20 U/L (ref 0–35)
AST: 18 U/L (ref 0–37)
Albumin: 4.2 g/dL (ref 3.5–5.2)
Alkaline Phosphatase: 54 U/L (ref 39–117)
BUN: 11 mg/dL (ref 6–23)
CO2: 27 mEq/L (ref 19–32)
Calcium: 8.9 mg/dL (ref 8.4–10.5)
Chloride: 105 mEq/L (ref 96–112)
Creatinine, Ser: 0.82 mg/dL (ref 0.40–1.20)
GFR: 83.73 mL/min (ref >60.00)
Glucose, Bld: 88 mg/dL (ref 70–99)
Potassium: 3.9 mEq/L (ref 3.5–5.1)
Sodium: 138 mEq/L (ref 135–145)
Total Bilirubin: 0.8 mg/dL (ref 0.2–1.2)
Total Protein: 6.6 g/dL (ref 6.0–8.3)

## 2021-03-25 LAB — CBC WITH DIFFERENTIAL/PLATELET
Basophils Absolute: 0 10*3/uL (ref 0.0–0.1)
Basophils Relative: 0.6 % (ref 0.0–3.0)
Eosinophils Absolute: 0.1 10*3/uL (ref 0.0–0.7)
Eosinophils Relative: 1.7 % (ref 0.0–5.0)
HCT: 37.1 % (ref 36.0–46.0)
Hemoglobin: 12.8 g/dL (ref 12.0–15.0)
Lymphocytes Relative: 37.4 % (ref 12.0–46.0)
Lymphs Abs: 1.6 10*3/uL (ref 0.7–4.0)
MCHC: 34.4 g/dL (ref 30.0–36.0)
MCV: 96.3 fl (ref 78.0–100.0)
Monocytes Absolute: 0.4 10*3/uL (ref 0.1–1.0)
Monocytes Relative: 8.5 % (ref 3.0–12.0)
Neutro Abs: 2.2 10*3/uL (ref 1.4–7.7)
Neutrophils Relative %: 51.8 % (ref 43.0–77.0)
Platelets: 174 10*3/uL (ref 150.0–400.0)
RBC: 3.85 Mil/uL — ABNORMAL LOW (ref 3.87–5.11)
RDW: 12.6 % (ref 11.5–15.5)
WBC: 4.2 10*3/uL (ref 4.0–10.5)

## 2021-03-25 LAB — LIPID PANEL
Cholesterol: 205 mg/dL — ABNORMAL HIGH (ref 0–200)
HDL: 68.6 mg/dL (ref >39.00)
LDL Cholesterol: 117 mg/dL — ABNORMAL HIGH (ref 0–99)
NonHDL: 136.45
Total CHOL/HDL Ratio: 3
Triglycerides: 95 mg/dL (ref 0.0–149.0)
VLDL: 19 mg/dL (ref 0.0–40.0)

## 2021-03-25 LAB — VITAMIN B12: Vitamin B-12: 450 pg/mL (ref 211–911)

## 2021-03-25 LAB — VITAMIN D 25 HYDROXY (VIT D DEFICIENCY, FRACTURES): VITD: 22.44 ng/mL — ABNORMAL LOW (ref 30.00–100.00)

## 2021-03-25 LAB — TSH: TSH: 1.5 u[IU]/mL (ref 0.35–5.50)

## 2021-04-09 ENCOUNTER — Other Ambulatory Visit: Payer: Self-pay | Admitting: Family Medicine

## 2021-05-15 ENCOUNTER — Other Ambulatory Visit: Payer: Self-pay | Admitting: Family Medicine

## 2021-05-15 DIAGNOSIS — Z1231 Encounter for screening mammogram for malignant neoplasm of breast: Secondary | ICD-10-CM

## 2021-05-18 ENCOUNTER — Other Ambulatory Visit: Payer: Self-pay | Admitting: Family Medicine

## 2021-05-18 NOTE — Telephone Encounter (Signed)
Last office visit 03/17/2021 for MDD & Chronic Insomnia.  Last refilled 03/17/2021 for #30 with 5 refills.  AVS states Return in about 4 weeks (around 04/14/2021) for  follow up mood.  No future appointments.  Pharmacy is requesting 90 day supply.

## 2021-05-22 ENCOUNTER — Other Ambulatory Visit (INDEPENDENT_AMBULATORY_CARE_PROVIDER_SITE_OTHER): Payer: BC Managed Care – PPO

## 2021-05-22 DIAGNOSIS — Z1211 Encounter for screening for malignant neoplasm of colon: Secondary | ICD-10-CM

## 2021-05-22 LAB — FECAL OCCULT BLOOD, IMMUNOCHEMICAL: Fecal Occult Bld: NEGATIVE

## 2021-06-19 ENCOUNTER — Ambulatory Visit
Admission: RE | Admit: 2021-06-19 | Discharge: 2021-06-19 | Disposition: A | Discharge status: Home / Self Care | Payer: BC Managed Care – PPO | Source: Ambulatory Visit | Attending: Family Medicine | Admitting: Family Medicine

## 2021-06-19 ENCOUNTER — Other Ambulatory Visit: Payer: Self-pay

## 2021-06-19 DIAGNOSIS — Z1231 Encounter for screening mammogram for malignant neoplasm of breast: Secondary | ICD-10-CM | POA: Insufficient documentation

## 2022-04-13 ENCOUNTER — Ambulatory Visit
Admission: EM | Admit: 2022-04-13 | Discharge: 2022-04-13 | Disposition: A | Discharge status: Home / Self Care | Payer: BC Managed Care – PPO | Attending: Emergency Medicine | Admitting: Emergency Medicine

## 2022-04-13 DIAGNOSIS — J209 Acute bronchitis, unspecified: Secondary | ICD-10-CM | POA: Diagnosis not present

## 2022-04-13 DIAGNOSIS — J0100 Acute maxillary sinusitis, unspecified: Secondary | ICD-10-CM

## 2022-04-13 MED ORDER — ALBUTEROL SULFATE HFA 108 (90 BASE) MCG/ACT IN AERS: 1.0000 | INHALATION_SPRAY | Freq: Four times a day (QID) | RESPIRATORY_TRACT | 0 refills | Status: DC | PRN

## 2022-04-13 MED ORDER — PREDNISONE 10 MG (21) PO TBPK: ORAL_TABLET | Freq: Every day | ORAL | 0 refills | Status: DC

## 2022-04-13 MED ORDER — AZITHROMYCIN 250 MG PO TABS: 250.0000 mg | ORAL_TABLET | Freq: Every day | ORAL | 0 refills | Status: DC

## 2022-04-13 MED ORDER — BENZONATATE 100 MG PO CAPS: 100.0000 mg | ORAL_CAPSULE | Freq: Three times a day (TID) | ORAL | 0 refills | Status: DC | PRN

## 2022-04-13 NOTE — ED Triage Notes (Signed)
Patient to Urgent Care with complaints of cough/ congestion and fevers (max 99-100) x2 weeks. States cough is productive.   Intermittent symptoms since December 14.   Has been taking sudafed/ tessalon pearls/ otc allergy meds/ ibuprofen.

## 2022-04-13 NOTE — ED Provider Notes (Signed)
[facility]    CSN: [id number] Arrival date & time: 04/13/22  1715      History   Chief Complaint Chief Complaint  Patient presents with   Cough    HPI Lynn Ortiz is a 51 y.o. female.  Patient presents with 2 week history of low grade fever, congestion, cough.  Tmax 100.  Treatment at home with Sudafed, OTC allergy medication, Tessalon Perles, ibuprofen.  The Jerilynn Som are her mothers and they are expired.  Patient denies chest pain, shortness of breath, or other symptoms.  Her medical history includes iron deficiency anemia, allergic rhinitis, fatigue, chronic insomnia, depression.  She reports having COVID last month.    The history is provided by medical records and the patient.    Past Medical History:  Diagnosis Date   Allergy    allerlgic rhinitis   Anemia    iron deficiency    Patient Active Problem List   Diagnosis Date Noted   Left knee pain 03/05/2021   COVID-19 virus infection 02/29/2020   Candidal intertrigo 11/14/2019   Screening mammogram, encounter for 11/30/2018   Depression, major, recurrent, moderate (HCC) 03/03/2017   Chronic insomnia 11/28/2012   ACNE VULGARIS 02/23/2008   ANEMIA-IRON DEFICIENCY 01/24/2008   ALLERGIC RHINITIS 01/24/2008   Fatigue 01/24/2008    Past Surgical History:  Procedure Laterality Date   CESAREAN SECTION     2003 NSVD, twin 2005   CRYOTHERAPY  1996   for dysplasia of cervix    OB History   No obstetric history on file.      Home Medications    Prior to Admission medications   Medication Sig Start Date End Date Taking? Authorizing Provider  albuterol (VENTOLIN HFA) 108 (90 Base) MCG/ACT inhaler Inhale 1-2 puffs into the lungs every 6 (six) hours as needed. 04/13/22  Yes Mickie Bail, NP  azithromycin (ZITHROMAX) 250 MG tablet Take 1 tablet (250 mg total) by mouth daily. Take first 2 tablets together, then 1 every day until finished. 04/13/22  Yes Mickie Bail, NP  benzonatate (TESSALON)  100 MG capsule Take 1 capsule (100 mg total) by mouth 3 (three) times daily as needed for cough. 04/13/22  Yes Mickie Bail, NP  predniSONE (STERAPRED UNI-PAK 21 TAB) 10 MG (21) TBPK tablet Take by mouth daily. As directed 04/13/22  Yes Mickie Bail, NP  acetaminophen (TYLENOL) 500 MG tablet Take 500 mg by mouth every 6 (six) hours as needed.    [provider]  buPROPion (WELLBUTRIN XL) 300 MG 24 hr tablet TAKE 1 TABLET BY MOUTH EVERY DAY 05/18/21   Bedsole, Amy E, MD  CALCIUM PO Take 1 tablet by mouth daily.    [provider]  diclofenac (VOLTAREN) 75 MG EC tablet Take 1 tablet (75 mg total) by mouth 2 (two) times daily. 02/12/21   Bedsole, Amy E, MD  melatonin 5 MG TABS Take 5 mg by mouth at bedtime as needed.    [provider]  Multiple Vitamin (MULTIVITAMIN) tablet Take 1 tablet by mouth daily.      [provider]    Family History Family History  Problem Relation Age of Onset   Emphysema Father     Social History Social History   Tobacco Use   Smoking status: Never   Smokeless tobacco: Never  Substance Use Topics   Alcohol use: Yes    Comment: 2 drinks per week   Drug use: No     Allergies  Patient has no known allergies.   Review of Systems Review of Systems  Constitutional:  Positive for fever. Negative for chills.  HENT:  Positive for congestion and postnasal drip. Negative for ear pain and sore throat.   Respiratory:  Positive for cough. Negative for shortness of breath.   Cardiovascular:  Negative for chest pain and palpitations.  Gastrointestinal:  Negative for diarrhea and vomiting.  Skin:  Negative for color change and rash.  All other systems reviewed and are negative.    Physical Exam Triage Vital Signs ED Triage Vitals  Enc Vitals Group     BP      Pulse      Resp      Temp      Temp src      SpO2      Weight      Height      Head Circumference      Peak Flow      Pain Score      Pain Loc      Pain  Edu?      Excl. in GC?    No data found.  Updated Vital Signs BP 121/80   Pulse 98   Temp 98.3 F (36.8 C)   Resp 19   Ht 5\' 3"  (1.6 m)   Wt 170 lb (77.1 kg)   LMP 04/01/2022   SpO2 98%   BMI 30.11 kg/m   Visual Acuity Right Eye Distance:   Left Eye Distance:   Bilateral Distance:    Right Eye Near:   Left Eye Near:    Bilateral Near:     Physical Exam Vitals and nursing note reviewed.  Constitutional:      General: She is not in acute distress.    Appearance: Normal appearance. She is well-developed. She is not ill-appearing.  HENT:     Right Ear: Tympanic membrane normal.     Left Ear: Tympanic membrane normal.     Nose: Congestion and rhinorrhea present.     Mouth/Throat:     Mouth: Mucous membranes are moist.     Pharynx: Oropharynx is clear.  Cardiovascular:     Rate and Rhythm: Normal rate and regular rhythm.     Heart sounds: Normal heart sounds.  Pulmonary:     Effort: Pulmonary effort is normal. No respiratory distress.     Breath sounds: Normal breath sounds. No wheezing, rhonchi or rales.  Musculoskeletal:     Cervical back: Neck supple.  Skin:    General: Skin is warm and dry.  Neurological:     Mental Status: She is alert.  Psychiatric:        Mood and Affect: Mood normal.        Behavior: Behavior normal.      UC Treatments / Results  Labs (all labs ordered are listed, but only abnormal results are displayed) Labs Reviewed - No data to display  EKG   Radiology No results found.  Procedures Procedures (including critical care time)  Medications Ordered in UC Medications - No data to display  Initial Impression / Assessment and Plan / UC Course  I have reviewed the triage vital signs and the nursing notes.  Pertinent labs & imaging results that were available during my care of the patient were reviewed by me and considered in my medical decision making (see chart for details).    Acute sinusitis and bronchitis.  Patient has  been symptomatic for 2 weeks.  Treating today  with albuterol inhaler, prednisone, Zithromax, Tessalon Perles.  Instructed patient to follow up with her PCP if her symptoms are not improving.  Education provided on sinus infection and bronchitis.  She agrees to plan of care.    Final Clinical Impressions(s) / UC Diagnoses   Final diagnoses:  Acute non-recurrent maxillary sinusitis  Acute bronchitis, unspecified organism     Discharge Instructions      Use the albuterol inhaler as directed.  Take the prednisone, Zithromax, and Tessalon Perles as directed.  Follow up with your primary care provider if your symptoms are not improving.        ED Prescriptions     Medication Sig Dispense Auth. Provider   benzonatate (TESSALON) 100 MG capsule Take 1 capsule (100 mg total) by mouth 3 (three) times daily as needed for cough. 21 capsule Mickie Bail, NP   albuterol (VENTOLIN HFA) 108 (90 Base) MCG/ACT inhaler Inhale 1-2 puffs into the lungs every 6 (six) hours as needed. 18 g Mickie Bail, NP   predniSONE (STERAPRED UNI-PAK 21 TAB) 10 MG (21) TBPK tablet Take by mouth daily. As directed 21 tablet Mickie Bail, NP   azithromycin (ZITHROMAX) 250 MG tablet Take 1 tablet (250 mg total) by mouth daily. Take first 2 tablets together, then 1 every day until finished. 6 tablet Mickie Bail, NP      PDMP not reviewed this encounter.   Mickie Bail, NP 04/13/22 [phone]

## 2022-04-13 NOTE — Discharge Instructions (Addendum)
Use the albuterol inhaler as directed.  Take the prednisone, Zithromax, and Tessalon Perles as directed.  Follow up with your primary care provider if your symptoms are not improving.

## 2022-04-14 ENCOUNTER — Ambulatory Visit: Payer: BC Managed Care – PPO | Admitting: Physician Assistant

## 2022-08-04 IMAGING — MG MM DIGITAL SCREENING BILAT W/ TOMO AND CAD
6 of 10 series · 6 of 30 positions shown · non-contrast
Comparison: Previous exam(s).

CLINICAL DATA: Screening.

EXAM:
DIGITAL SCREENING BILATERAL MAMMOGRAM WITH TOMOSYNTHESIS AND CAD
TECHNIQUE: Bilateral screening digital craniocaudal and mediolateral oblique
mammograms were obtained. Bilateral screening digital breast
tomosynthesis was performed. The images were evaluated with
computer-aided detection.

[R MLO synth-2D (1 of 2)]
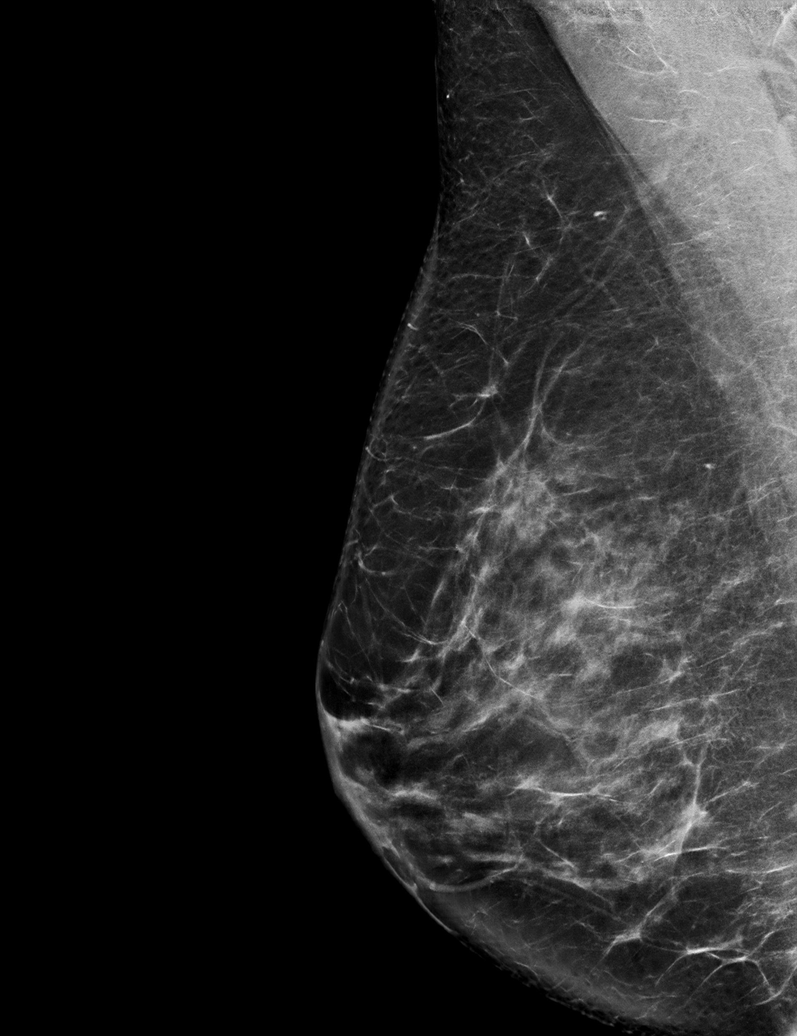

[R MLO synth-2D (2 of 2)]
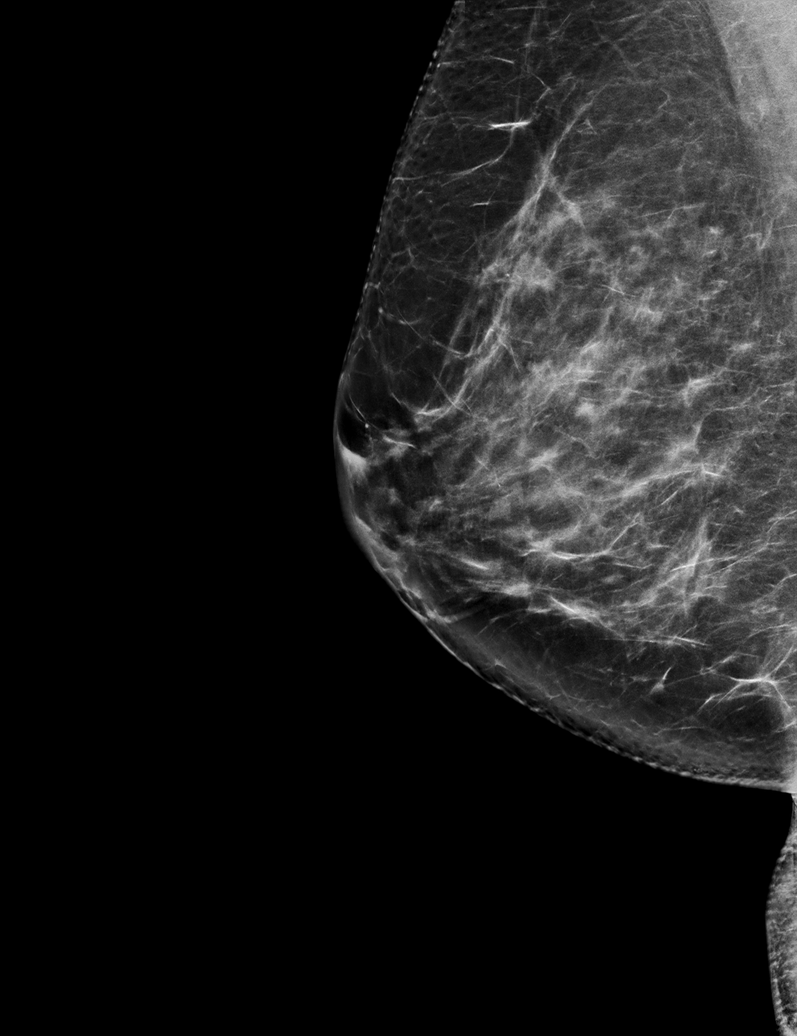

[R CC synth-2D]
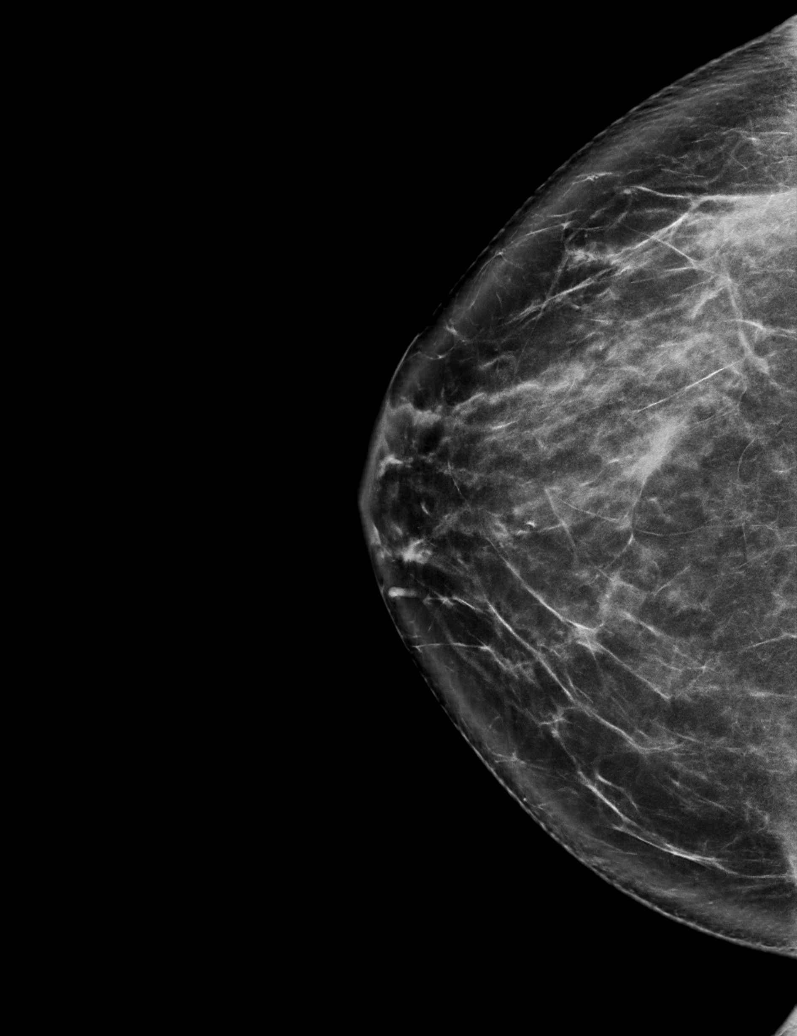

[L MLO synth-2D]
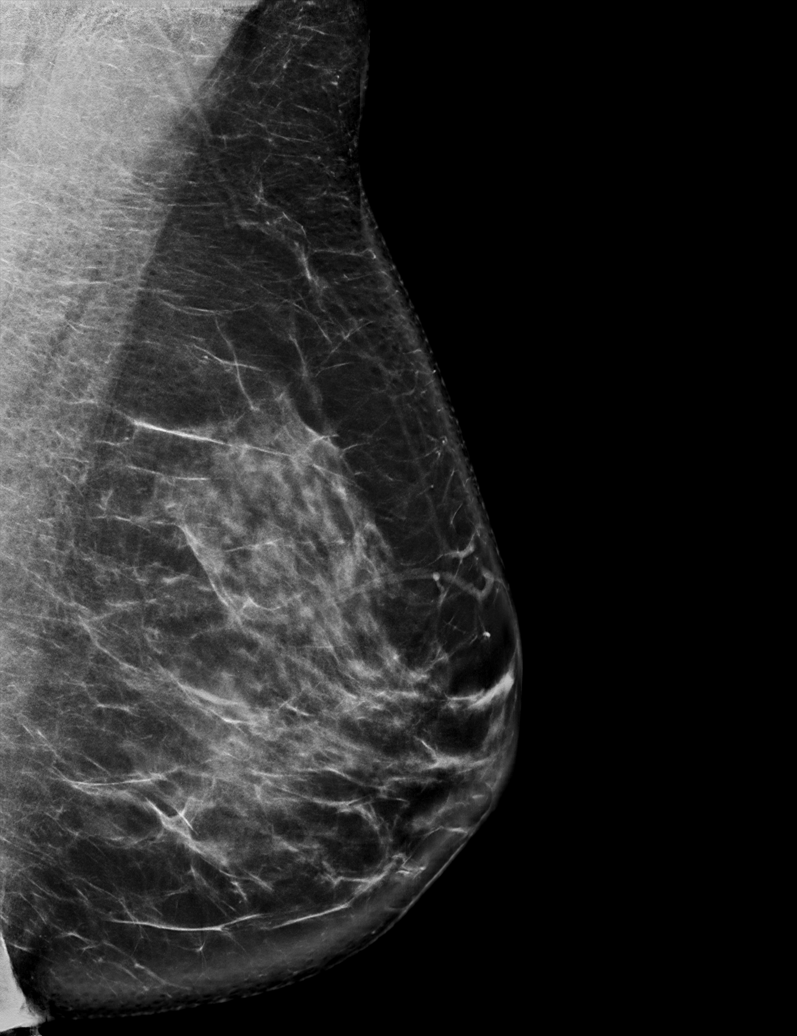

[L CC synth-2D]
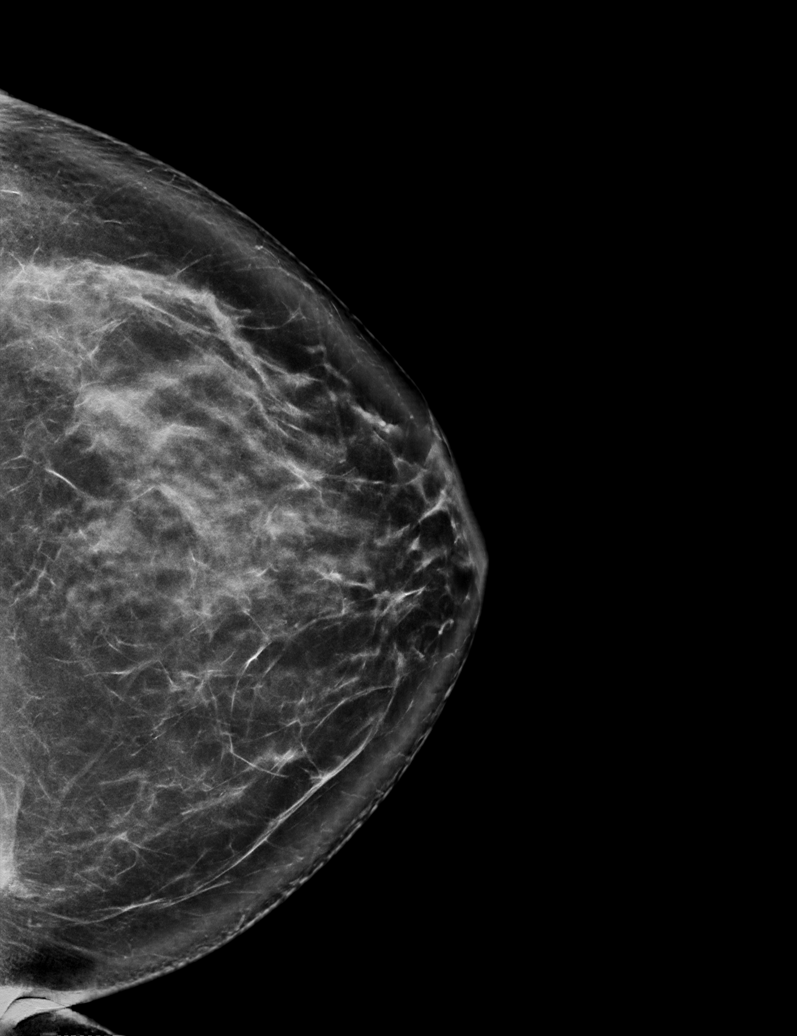

[R MLO tomo · tomo slice 46/91.0]
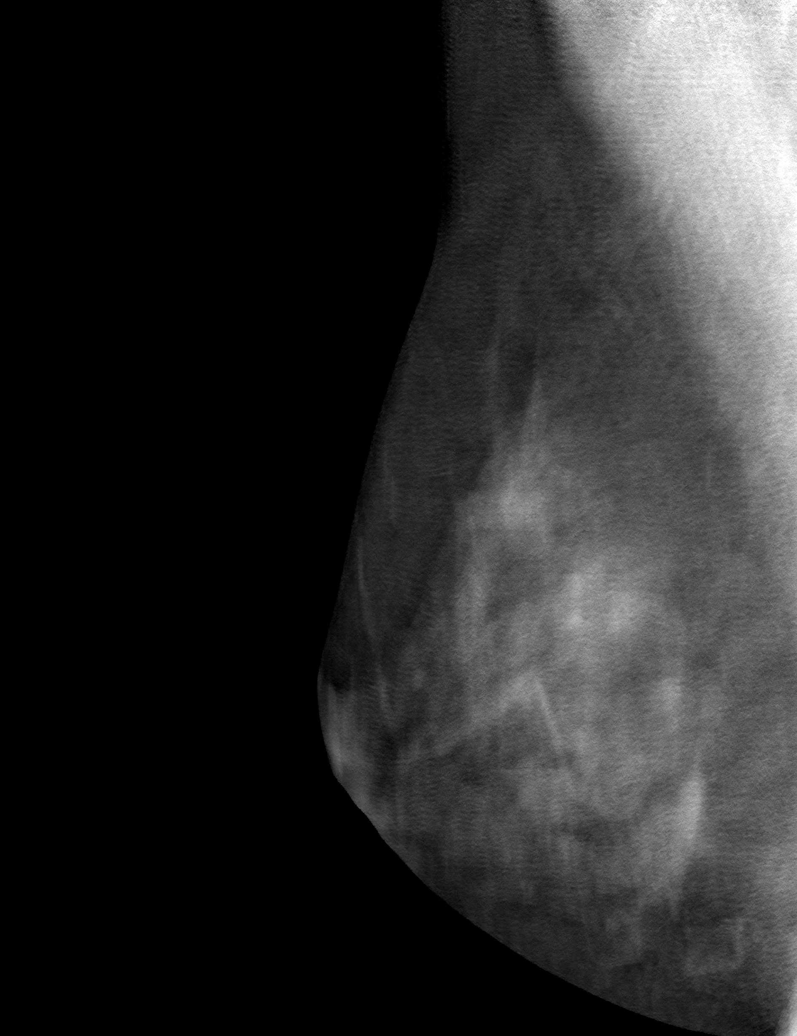

[6 of 30 positions shown; findings below may reference images not displayed]

ACR Breast Density Category c: The breast tissue is heterogeneously
dense, which may obscure small masses.
FINDINGS: There are no findings suspicious for malignancy.
IMPRESSION: No mammographic evidence of malignancy. A result letter of this
screening mammogram will be mailed directly to the patient.

RECOMMENDATION:
Screening mammogram in one year. (Code:[id number])

BI-RADS CATEGORY  1: Negative.

## 2022-08-05 ENCOUNTER — Telehealth: Payer: Self-pay | Admitting: Family Medicine

## 2022-08-05 ENCOUNTER — Encounter: Payer: Self-pay | Admitting: Family Medicine

## 2022-08-05 DIAGNOSIS — Z1231 Encounter for screening mammogram for malignant neoplasm of breast: Secondary | ICD-10-CM

## 2022-08-05 NOTE — Telephone Encounter (Signed)
Scheduled cpe for 08/19/22

## 2022-08-05 NOTE — Telephone Encounter (Signed)
My records patient has not been seen since 2022.  She needs complete physical exam prior to doing an order for screening mammogram. If she is unable to do this please let me know

## 2022-08-05 NOTE — Telephone Encounter (Signed)
Pt called asking if Lynn Ortiz would send in a referral to [facility] at [facility] for a mammogram? Call back # [phone]

## 2022-08-12 ENCOUNTER — Other Ambulatory Visit (INDEPENDENT_AMBULATORY_CARE_PROVIDER_SITE_OTHER): Payer: BC Managed Care – PPO

## 2022-08-12 DIAGNOSIS — E559 Vitamin D deficiency, unspecified: Secondary | ICD-10-CM

## 2022-08-12 DIAGNOSIS — D509 Iron deficiency anemia, unspecified: Secondary | ICD-10-CM

## 2022-08-12 DIAGNOSIS — Z13220 Encounter for screening for lipoid disorders: Secondary | ICD-10-CM | POA: Diagnosis not present

## 2022-08-12 DIAGNOSIS — Z114 Encounter for screening for human immunodeficiency virus [HIV]: Secondary | ICD-10-CM | POA: Diagnosis not present

## 2022-08-12 LAB — COMPREHENSIVE METABOLIC PANEL
ALT: 21 U/L (ref 0–35)
AST: 23 U/L (ref 0–37)
Albumin: 4.3 g/dL (ref 3.5–5.2)
Alkaline Phosphatase: 53 U/L (ref 39–117)
BUN: 13 mg/dL (ref 6–23)
CO2: 25 mEq/L (ref 19–32)
Calcium: 9 mg/dL (ref 8.4–10.5)
Chloride: 105 mEq/L (ref 96–112)
Creatinine, Ser: 0.8 mg/dL (ref 0.40–1.20)
GFR: 85.42 mL/min (ref >60.00)
Glucose, Bld: 92 mg/dL (ref 70–99)
Potassium: 3.6 mEq/L (ref 3.5–5.1)
Sodium: 142 mEq/L (ref 135–145)
Total Bilirubin: 0.6 mg/dL (ref 0.2–1.2)
Total Protein: 6.8 g/dL (ref 6.0–8.3)

## 2022-08-12 LAB — CBC WITH DIFFERENTIAL/PLATELET
Basophils Absolute: 0 10*3/uL (ref 0.0–0.1)
Basophils Relative: 0.5 % (ref 0.0–3.0)
Eosinophils Absolute: 0.1 10*3/uL (ref 0.0–0.7)
Eosinophils Relative: 1.6 % (ref 0.0–5.0)
HCT: 37.1 % (ref 36.0–46.0)
Hemoglobin: 13 g/dL (ref 12.0–15.0)
Lymphocytes Relative: 37.4 % (ref 12.0–46.0)
Lymphs Abs: 1.6 10*3/uL (ref 0.7–4.0)
MCHC: 35.1 g/dL (ref 30.0–36.0)
MCV: 95.2 fl (ref 78.0–100.0)
Monocytes Absolute: 0.4 10*3/uL (ref 0.1–1.0)
Monocytes Relative: 9.3 % (ref 3.0–12.0)
Neutro Abs: 2.2 10*3/uL (ref 1.4–7.7)
Neutrophils Relative %: 51.2 % (ref 43.0–77.0)
Platelets: 175 10*3/uL (ref 150.0–400.0)
RBC: 3.9 Mil/uL (ref 3.87–5.11)
RDW: 12.4 % (ref 11.5–15.5)
WBC: 4.4 10*3/uL (ref 4.0–10.5)

## 2022-08-12 LAB — LIPID PANEL
Cholesterol: 193 mg/dL (ref 0–200)
HDL: 65.2 mg/dL (ref >39.00)
LDL Cholesterol: 108 mg/dL — ABNORMAL HIGH (ref 0–99)
NonHDL: 128.24
Total CHOL/HDL Ratio: 3
Triglycerides: 100 mg/dL (ref 0.0–149.0)
VLDL: 20 mg/dL (ref 0.0–40.0)

## 2022-08-12 LAB — VITAMIN D 25 HYDROXY (VIT D DEFICIENCY, FRACTURES): VITD: 37.11 ng/mL (ref 30.00–100.00)

## 2022-08-12 LAB — IBC + FERRITIN
Ferritin: 32.2 ng/mL (ref 10.0–291.0)
Iron: 89 ug/dL (ref 42–145)
Saturation Ratios: 25.6 % (ref 20.0–50.0)
TIBC: 347.2 ug/dL (ref 250.0–450.0)
Transferrin: 248 mg/dL (ref 212.0–360.0)

## 2022-08-12 NOTE — Progress Notes (Signed)
No critical labs need to be addressed urgently. We will discuss labs in detail at upcoming office visit.   

## 2022-08-13 LAB — HIV ANTIBODY (ROUTINE TESTING W REFLEX): HIV 1&2 Ab, 4th Generation: NONREACTIVE

## 2022-08-13 NOTE — Progress Notes (Signed)
No critical labs need to be addressed urgently. We will discuss labs in detail at upcoming office visit.   

## 2022-08-19 ENCOUNTER — Encounter: Payer: Self-pay | Admitting: Family Medicine

## 2022-08-19 ENCOUNTER — Ambulatory Visit (INDEPENDENT_AMBULATORY_CARE_PROVIDER_SITE_OTHER): Payer: BC Managed Care – PPO | Admitting: Family Medicine

## 2022-08-19 VITALS — BP 106/74 | HR 77 | Temp 98.7°F | Ht 63.0 in | Wt 171.0 lb

## 2022-08-19 DIAGNOSIS — Z1211 Encounter for screening for malignant neoplasm of colon: Secondary | ICD-10-CM

## 2022-08-19 DIAGNOSIS — D508 Other iron deficiency anemias: Secondary | ICD-10-CM

## 2022-08-19 DIAGNOSIS — Z0000 Encounter for general adult medical examination without abnormal findings: Secondary | ICD-10-CM | POA: Diagnosis not present

## 2022-08-19 DIAGNOSIS — Z1231 Encounter for screening mammogram for malignant neoplasm of breast: Secondary | ICD-10-CM

## 2022-08-19 DIAGNOSIS — R5383 Other fatigue: Secondary | ICD-10-CM

## 2022-08-19 DIAGNOSIS — F5104 Psychophysiologic insomnia: Secondary | ICD-10-CM

## 2022-08-19 DIAGNOSIS — N951 Menopausal and female climacteric states: Secondary | ICD-10-CM

## 2022-08-19 DIAGNOSIS — F331 Major depressive disorder, recurrent, moderate: Secondary | ICD-10-CM

## 2022-08-19 NOTE — Progress Notes (Signed)
Patient ID: Lynn Ortiz, female    DOB: [birth year], 53 y.o.   MRN: [id number]  This visit was conducted in person.  BP 106/74   Pulse 77   Temp 98.7 F (37.1 C) (Temporal)   Ht 5\' 3"  (1.6 m)   Wt 171 lb (77.6 kg)   SpO2 97%   BMI 30.29 kg/m    CC: Chief Complaint  Patient presents with   Annual Exam    Labs printed     Subjective:   HPI: Lynn Ortiz is a 52 y.o. female presenting on 08/19/2022 for Annual Exam (Labs printed ) The patient presents for  complete physical and review of chronic health problems. He/She also has the following acute concerns today:   She is reporting continued fatigue. Vit D, B12 nml, nml CMET, cbc nml  Minimal  exercise.  Occ snoring possibly.. husband snores at night. Feeling tired or sedated endurance raising as well as wake up right off the bat feeling tired tired did you ever have a headache in the morning when you wake up nothing like that the computer recorder wakes up and starts  She has noted regular menses until 1 or 2.. then missed a few menses.  No hot flashes.  MDD/GAD: Well-controlled on no medication.    08/19/2022    3:09 PM 02/12/2021    2:43 PM  GAD 7 : Generalized Anxiety Score  Nervous, Anxious, on Edge 0 2  Control/stop worrying 0 2  Worry too much - different things 1 2  Trouble relaxing 0 0  Restless 0 0  Easily annoyed or irritable 0 1  Afraid - awful might happen 0 1  Total GAD 7 Score 1 8  Anxiety Difficulty Not difficult at all Somewhat difficult      08/19/2022    3:08 PM 03/17/2021    4:15 PM 02/12/2021    2:44 PM  PHQ9 SCORE ONLY  PHQ-9 Total Score 1 8 10     Reviewed labs.   The 10-year ASCVD risk score (Arnett DK, et al., 2019) is: 0.7%   Values used to calculate the score:     Age: 84 years     Sex: Female     Is Non-Hispanic African American: No     Diabetic: No     Tobacco smoker: No     Systolic Blood Pressure: 106 mmHg     Is BP treated: No     HDL Cholesterol: 65.2 mg/dL     Total  Cholesterol: 193 mg/dL   Relevant past medical, surgical, family and social history reviewed and updated as indicated. Interim medical history since our last visit reviewed. Allergies and medications reviewed and updated. Outpatient Medications Prior to Visit  Medication Sig Dispense Refill   acetaminophen (TYLENOL) 500 MG tablet Take 500 mg by mouth every 6 (six) hours as needed.     albuterol (VENTOLIN HFA) 108 (90 Base) MCG/ACT inhaler Inhale 1-2 puffs into the lungs every 6 (six) hours as needed. (Patient not taking: Reported on 08/19/2022) 18 g 0   buPROPion (WELLBUTRIN XL) 300 MG 24 hr tablet TAKE 1 TABLET BY MOUTH EVERY DAY (Patient not taking: Reported on 08/19/2022) 90 tablet 0   CALCIUM PO Take 1 tablet by mouth daily. (Patient not taking: Reported on 08/19/2022)     diclofenac (VOLTAREN) 75 MG EC tablet Take 1 tablet (75 mg total) by mouth 2 (two) times daily. (Patient not taking: Reported on 08/19/2022) 30 tablet 0  melatonin 5 MG TABS Take 5 mg by mouth at bedtime as needed. (Patient not taking: Reported on 08/19/2022)     Multiple Vitamin (MULTIVITAMIN) tablet Take 1 tablet by mouth daily.   (Patient not taking: Reported on 08/19/2022)     predniSONE (STERAPRED UNI-PAK 21 TAB) 10 MG (21) TBPK tablet Take by mouth daily. As directed (Patient not taking: Reported on 08/19/2022) 21 tablet 0   azithromycin (ZITHROMAX) 250 MG tablet Take 1 tablet (250 mg total) by mouth daily. Take first 2 tablets together, then 1 every day until finished. 6 tablet 0   benzonatate (TESSALON) 100 MG capsule Take 1 capsule (100 mg total) by mouth 3 (three) times daily as needed for cough. 21 capsule 0   No facility-administered medications prior to visit.     Per HPI unless specifically indicated in ROS section below Review of Systems  Constitutional:  Negative for fatigue and fever.  HENT:  Negative for congestion.   Eyes:  Negative for pain.  Respiratory:  Negative for cough and shortness of breath.    Cardiovascular:  Negative for chest pain, palpitations and leg swelling.  Gastrointestinal:  Negative for abdominal pain.  Genitourinary:  Negative for dysuria and vaginal bleeding.  Musculoskeletal:  Negative for back pain.  Neurological:  Negative for syncope, light-headedness and headaches.  Psychiatric/Behavioral:  Negative for dysphoric mood.    Objective:  BP 106/74   Pulse 77   Temp 98.7 F (37.1 C) (Temporal)   Ht 5\' 3"  (1.6 m)   Wt 171 lb (77.6 kg)   SpO2 97%   BMI 30.29 kg/m   Wt Readings from Last 3 Encounters:  08/19/22 171 lb (77.6 kg)  04/13/22 170 lb (77.1 kg)  02/12/21 169 lb (76.7 kg)      Physical Exam Vitals and nursing note reviewed.  Constitutional:      General: She is not in acute distress.    Appearance: Normal appearance. She is well-developed. She is not ill-appearing or toxic-appearing.  HENT:     Head: Normocephalic.     Right Ear: Hearing, tympanic membrane, ear canal and external ear normal.     Left Ear: Hearing, tympanic membrane, ear canal and external ear normal.     Nose: Nose normal.  Eyes:     General: Lids are normal. Lids are everted, no foreign bodies appreciated.     Conjunctiva/sclera: Conjunctivae normal.     Pupils: Pupils are equal, round, and reactive to light.  Neck:     Thyroid: No thyroid mass or thyromegaly.     Vascular: No carotid bruit.     Trachea: Trachea normal.  Cardiovascular:     Rate and Rhythm: Normal rate and regular rhythm.     Heart sounds: Normal heart sounds, S1 normal and S2 normal. No murmur heard.    No gallop.  Pulmonary:     Effort: Pulmonary effort is normal. No respiratory distress.     Breath sounds: Normal breath sounds. No wheezing, rhonchi or rales.  Abdominal:     General: Bowel sounds are normal. There is no distension or abdominal bruit.     Palpations: Abdomen is soft. There is no fluid wave or mass.     Tenderness: There is no abdominal tenderness. There is no guarding or rebound.      Hernia: No hernia is present.  Musculoskeletal:     Cervical back: Normal range of motion and neck supple.  Lymphadenopathy:     Cervical: No cervical adenopathy.  Skin:    General: Skin is warm and dry.     Findings: No rash.  Neurological:     Mental Status: She is alert.     Cranial Nerves: No cranial nerve deficit.     Sensory: No sensory deficit.  Psychiatric:        Mood and Affect: Mood is not anxious or depressed.        Speech: Speech normal.        Behavior: Behavior normal. Behavior is cooperative.        Judgment: Judgment normal.       Results for orders placed or performed in visit on 08/12/22  Comprehensive metabolic panel  Result Value Ref Range   Sodium 142 135 - 145 mEq/L   Potassium 3.6 3.5 - 5.1 mEq/L   Chloride 105 96 - 112 mEq/L   CO2 25 19 - 32 mEq/L   Glucose, Bld 92 70 - 99 mg/dL   BUN 13 6 - 23 mg/dL   Creatinine, Ser 6.04 0.40 - 1.20 mg/dL   Total Bilirubin 0.6 0.2 - 1.2 mg/dL   Alkaline Phosphatase 53 39 - 117 U/L   AST 23 0 - 37 U/L   ALT 21 0 - 35 U/L   Total Protein 6.8 6.0 - 8.3 g/dL   Albumin 4.3 3.5 - 5.2 g/dL   GFR 54.09 >81.19 mL/min   Calcium 9.0 8.4 - 10.5 mg/dL  Lipid panel  Result Value Ref Range   Cholesterol 193 0 - 200 mg/dL   Triglycerides 147.8 0.0 - 149.0 mg/dL   HDL 29.56 >21.30 mg/dL   VLDL 86.5 0.0 - 78.4 mg/dL   LDL Cholesterol 696 (H) 0 - 99 mg/dL   Total CHOL/HDL Ratio 3    NonHDL 128.24   HIV Antibody (routine testing w rflx)  Result Value Ref Range   HIV 1&2 Ab, 4th Generation NON-REACTIVE NON-REACTIVE  VITAMIN D 25 Hydroxy (Vit-D Deficiency, Fractures)  Result Value Ref Range   VITD 37.11 30.00 - 100.00 ng/mL  CBC with Differential/Platelet  Result Value Ref Range   WBC 4.4 4.0 - 10.5 K/uL   RBC 3.90 3.87 - 5.11 Mil/uL   Hemoglobin 13.0 12.0 - 15.0 g/dL   HCT 29.5 28.4 - 13.2 %   MCV 95.2 78.0 - 100.0 fl   MCHC 35.1 30.0 - 36.0 g/dL   RDW 44.0 10.2 - 72.5 %   Platelets 175.0 150.0 - 400.0 K/uL    Neutrophils Relative % 51.2 43.0 - 77.0 %   Lymphocytes Relative 37.4 12.0 - 46.0 %   Monocytes Relative 9.3 3.0 - 12.0 %   Eosinophils Relative 1.6 0.0 - 5.0 %   Basophils Relative 0.5 0.0 - 3.0 %   Neutro Abs 2.2 1.4 - 7.7 K/uL   Lymphs Abs 1.6 0.7 - 4.0 K/uL   Monocytes Absolute 0.4 0.1 - 1.0 K/uL   Eosinophils Absolute 0.1 0.0 - 0.7 K/uL   Basophils Absolute 0.0 0.0 - 0.1 K/uL  IBC + Ferritin  Result Value Ref Range   Iron 89 42 - 145 ug/dL   Transferrin 366.4 403.4 - 360.0 mg/dL   Saturation Ratios 74.2 20.0 - 50.0 %   Ferritin 32.2 10.0 - 291.0 ng/mL   TIBC 347.2 250.0 - 450.0 mcg/dL    This visit occurred during the SARS-CoV-2 public health emergency.  Safety protocols were in place, including screening questions prior to the visit, additional usage of staff PPE, and extensive cleaning of exam room while observing  appropriate contact time as indicated for disinfecting solutions.   COVID 19 screen:  No recent travel or known exposure to COVID19 The patient denies respiratory symptoms of COVID 19 at this time. The importance of social distancing was discussed today.   Assessment and Plan The patient's preventative maintenance and recommended screening tests for an annual wellness exam were reviewed in full today. Brought up to date unless services declined.  Counselled on the importance of diet, exercise, and its role in overall health and mortality. The patient's FH and SH was reviewed, including their home life, tobacco status, and drug and alcohol status.   Mammo:last nml 06/3021  PAP/DVE:01/2021 repeat in 5 years  Vaccines: Td uptodate, COVID19 vaccine x 2, consider shingles vaccine  Colon: no colon cancer early age in family... iFOB - 05/22/2021  STD testing:refused Hep C: done  Routine general medical examination at a health care facility  Encounter for screening mammogram for malignant neoplasm of breast  Screening for colon cancer -     Ambulatory referral to  Gastroenterology  Depression, major, recurrent, moderate (HCC) Assessment & Plan: Chronic, well-controlled   On no medication... improved with job change.   Screening mammogram for breast cancer  Other fatigue Assessment & Plan: Chronic, persistent for several years.  Workup in 2022 was negative... Labs including CBC and vitamin D within normal limits today. Will reevaluate thyroid function tests and B12 level.  Mood well-controlled per patient.  She feels like she gets adequate sleep at night.  She has been told she snores low so we may want to look into sleep apnea testing.  She is now perimenopausal. She does not exercise and some of her fatigue may be due to deconditioning.  I have encouraged her to start regular exercise daily.  If fatigue not improving she will follow-up for reevaluation  Orders: -     TSH -     Vitamin B12 -     T4, free -     T3, free  Chronic insomnia Assessment & Plan: Resolved.   Perimenopause  Other iron deficiency anemia Assessment & Plan: Resolved.      No orders of the defined types were placed in this encounter.   Orders Placed This Encounter  Procedures   TSH   Vitamin B12   T4, free   T3, free   Ambulatory referral to Gastroenterology    Referral Priority:   Routine    Referral Type:   Consultation    Referral Reason:   Specialty Services Required    Number of Visits Requested:   1    Kerby Nora, MD

## 2022-08-19 NOTE — Assessment & Plan Note (Signed)
Chronic, persistent for several years.  Workup in 2022 was negative... Labs including CBC and vitamin D within normal limits today. Will reevaluate thyroid function tests and B12 level.  Mood well-controlled per patient.  She feels like she gets adequate sleep at night.  She has been told she snores low so we may want to look into sleep apnea testing.  She is now perimenopausal. She does not exercise and some of her fatigue may be due to deconditioning.  I have encouraged her to start regular exercise daily.  If fatigue not improving she will follow-up for reevaluation

## 2022-08-19 NOTE — Patient Instructions (Addendum)
Please stop at the lab to have labs drawn.  Start regular exercise daily if able. Please call the location of your choice from the menu below to schedule your Mammogram and/or Bone Density appointment.    [facility] at [facility]   Phone:  [phone]   [address]                                                                            [place], [place] [address]                                            Services: 3D Mammogram and Bone [facility] at [facility])  Phone:  [phone]   [address]                        [place], [place] [address]                                              Services:  3D Mammogram and Bone Density

## 2022-08-19 NOTE — Assessment & Plan Note (Signed)
Resolved

## 2022-08-19 NOTE — Assessment & Plan Note (Addendum)
Chronic, well-controlled   On no medication... improved with job change.

## 2022-08-20 ENCOUNTER — Telehealth: Payer: Self-pay

## 2022-08-20 ENCOUNTER — Other Ambulatory Visit: Payer: Self-pay

## 2022-08-20 DIAGNOSIS — Z1211 Encounter for screening for malignant neoplasm of colon: Secondary | ICD-10-CM

## 2022-08-20 LAB — VITAMIN B12: Vitamin B-12: 626 pg/mL (ref 211–911)

## 2022-08-20 LAB — T3, FREE: T3, Free: 3.4 pg/mL (ref 2.3–4.2)

## 2022-08-20 LAB — TSH: TSH: 1.02 u[IU]/mL (ref 0.35–5.50)

## 2022-08-20 LAB — T4, FREE: Free T4: 0.87 ng/dL (ref 0.60–1.60)

## 2022-08-20 MED ORDER — NA SULFATE-K SULFATE-MG SULF 17.5-3.13-1.6 GM/177ML PO SOLN: 1.0000 | Freq: Once | ORAL | 0 refills | Status: AC

## 2022-08-20 NOTE — Telephone Encounter (Signed)
Gastroenterology Pre-Procedure Review  Request Date: 01/28/23 Requesting Physician: Dr. Allegra Lai  PATIENT REVIEW QUESTIONS: The patient responded to the following health history questions as indicated:    1. Are you having any GI issues? no 2. Do you have a personal history of Polyps? no 3. Do you have a family history of Colon Cancer or Polyps? no 4. Diabetes Mellitus? no 5. Joint replacements in the past 12 months?no 6. Major health problems in the past 3 months?no 7. Any artificial heart valves, MVP, or defibrillator?no    MEDICATIONS & ALLERGIES:    Patient reports the following regarding taking any anticoagulation/antiplatelet therapy:   Plavix, Coumadin, Eliquis, Xarelto, Lovenox, Pradaxa, Brilinta, or Effient? no Aspirin? no  Patient confirms/reports the following medications:  Current Outpatient Medications  Medication Sig Dispense Refill   acetaminophen (TYLENOL) 500 MG tablet Take 500 mg by mouth every 6 (six) hours as needed. (Patient not taking: Reported on 08/20/2022)     albuterol (VENTOLIN HFA) 108 (90 Base) MCG/ACT inhaler Inhale 1-2 puffs into the lungs every 6 (six) hours as needed. (Patient not taking: Reported on 08/19/2022) 18 g 0   buPROPion (WELLBUTRIN XL) 300 MG 24 hr tablet TAKE 1 TABLET BY MOUTH EVERY DAY (Patient not taking: Reported on 08/19/2022) 90 tablet 0   CALCIUM PO Take 1 tablet by mouth daily. (Patient not taking: Reported on 08/19/2022)     diclofenac (VOLTAREN) 75 MG EC tablet Take 1 tablet (75 mg total) by mouth 2 (two) times daily. (Patient not taking: Reported on 08/19/2022) 30 tablet 0   melatonin 5 MG TABS Take 5 mg by mouth at bedtime as needed. (Patient not taking: Reported on 08/19/2022)     Multiple Vitamin (MULTIVITAMIN) tablet Take 1 tablet by mouth daily.   (Patient not taking: Reported on 08/19/2022)     predniSONE (STERAPRED UNI-PAK 21 TAB) 10 MG (21) TBPK tablet Take by mouth daily. As directed (Patient not taking: Reported on 08/19/2022) 21 tablet 0    No current facility-administered medications for this visit.    Patient confirms/reports the following allergies:  No Known Allergies  No orders of the defined types were placed in this encounter.   AUTHORIZATION INFORMATION Primary Insurance: 1D#: Group #:  Secondary Insurance: 1D#: Group #:  SCHEDULE INFORMATION: Date: 01/28/23 Time: Location: [facility]

## 2023-01-21 ENCOUNTER — Telehealth: Payer: Self-pay

## 2023-01-21 DIAGNOSIS — Z1211 Encounter for screening for malignant neoplasm of colon: Secondary | ICD-10-CM

## 2023-01-21 NOTE — Telephone Encounter (Signed)
Pt requesting call back to  reschedule  procedure

## 2023-01-21 NOTE — Telephone Encounter (Signed)
Patient call has been returned to reschedule her colonoscopy.  Colonoscopy has been rescheduled from 01/28/23 with Dr. Allegra Lai to 05/27/23.  [facility] notified of date change.  Instructions updated.  Referral updated.  Thanks, [place], [place]

## 2023-02-09 ENCOUNTER — Other Ambulatory Visit: Payer: Self-pay | Admitting: Family Medicine

## 2023-02-09 DIAGNOSIS — Z1231 Encounter for screening mammogram for malignant neoplasm of breast: Secondary | ICD-10-CM

## 2023-02-17 DIAGNOSIS — Z23 Encounter for immunization: Secondary | ICD-10-CM | POA: Diagnosis not present

## 2023-03-24 ENCOUNTER — Ambulatory Visit
Admission: RE | Admit: 2023-03-24 | Discharge: 2023-03-24 | Disposition: A | Discharge status: Home / Self Care | Payer: BC Managed Care – PPO | Source: Ambulatory Visit | Attending: Family Medicine | Admitting: Family Medicine

## 2023-03-24 DIAGNOSIS — Z1231 Encounter for screening mammogram for malignant neoplasm of breast: Secondary | ICD-10-CM | POA: Diagnosis not present

## 2023-05-20 ENCOUNTER — Telehealth: Payer: Self-pay

## 2023-05-20 DIAGNOSIS — Z1211 Encounter for screening for malignant neoplasm of colon: Secondary | ICD-10-CM

## 2023-05-20 NOTE — Telephone Encounter (Signed)
Pt called to cancel and reschedule colonoscopy

## 2023-05-23 ENCOUNTER — Other Ambulatory Visit: Payer: Self-pay

## 2023-05-23 DIAGNOSIS — Z1211 Encounter for screening for malignant neoplasm of colon: Secondary | ICD-10-CM

## 2023-05-23 NOTE — Telephone Encounter (Signed)
Call returned.  Colonoscopy has been re-ordered for 07/08/23 with Dr. Allegra Lai at [facility].  Referral updated. Instructions updated.  Thanks,  [place], [place]

## 2023-05-23 NOTE — Telephone Encounter (Signed)
The patient called in to reschedule her procedure and she prefer Fridays.

## 2023-05-23 NOTE — Telephone Encounter (Signed)
 okay

## 2023-05-27 ENCOUNTER — Ambulatory Visit
Admission: RE | Admit: 2023-05-27 | Payer: BC Managed Care – PPO | Source: Home / Self Care | Admitting: Gastroenterology

## 2023-05-27 SURGERY — COLONOSCOPY WITH PROPOFOL
Anesthesia: General

## 2023-07-04 ENCOUNTER — Telehealth: Payer: Self-pay

## 2023-07-04 NOTE — Telephone Encounter (Signed)
 Pt requesting call back to reschedule colonoscopy

## 2023-07-05 NOTE — Telephone Encounter (Signed)
 Pt requested a callback in June to reschedule her 07/07/23 colonoscopy due to her mother in law is in from [place].    Laquita in Endo has been made aware of cancellation request.  Thanks,  [place]arcelino [place], [place]

## 2023-07-08 ENCOUNTER — Encounter: Payer: Self-pay | Admitting: Family Medicine

## 2023-07-08 ENCOUNTER — Ambulatory Visit: Admit: 2023-07-08 | Payer: BC Managed Care – PPO | Admitting: Gastroenterology

## 2023-07-08 SURGERY — COLONOSCOPY WITH PROPOFOL
Anesthesia: General

## 2023-07-12 ENCOUNTER — Encounter: Payer: Self-pay | Admitting: Family Medicine

## 2023-07-12 ENCOUNTER — Ambulatory Visit: Admitting: Family Medicine

## 2023-07-12 VITALS — BP 100/68 | HR 82 | Temp 97.9°F | Ht 63.0 in | Wt 185.1 lb

## 2023-07-12 DIAGNOSIS — Z833 Family history of diabetes mellitus: Secondary | ICD-10-CM | POA: Diagnosis not present

## 2023-07-12 DIAGNOSIS — R232 Flushing: Secondary | ICD-10-CM | POA: Diagnosis not present

## 2023-07-12 DIAGNOSIS — R5383 Other fatigue: Secondary | ICD-10-CM

## 2023-07-12 DIAGNOSIS — N951 Menopausal and female climacteric states: Secondary | ICD-10-CM | POA: Diagnosis not present

## 2023-07-12 LAB — CBC WITH DIFFERENTIAL/PLATELET
Basophils Absolute: 0 10*3/uL (ref 0.0–0.1)
Basophils Relative: 0.2 % (ref 0.0–3.0)
Eosinophils Absolute: 0.1 10*3/uL (ref 0.0–0.7)
Eosinophils Relative: 1.8 % (ref 0.0–5.0)
HCT: 38.7 % (ref 36.0–46.0)
Hemoglobin: 13.4 g/dL (ref 12.0–15.0)
Lymphocytes Relative: 33.1 % (ref 12.0–46.0)
Lymphs Abs: 1.7 10*3/uL (ref 0.7–4.0)
MCHC: 34.7 g/dL (ref 30.0–36.0)
MCV: 96.4 fl (ref 78.0–100.0)
Monocytes Absolute: 0.5 10*3/uL (ref 0.1–1.0)
Monocytes Relative: 10 % (ref 3.0–12.0)
Neutro Abs: 2.8 10*3/uL (ref 1.4–7.7)
Neutrophils Relative %: 54.9 % (ref 43.0–77.0)
Platelets: 200 10*3/uL (ref 150.0–400.0)
RBC: 4.02 Mil/uL (ref 3.87–5.11)
RDW: 12.5 % (ref 11.5–15.5)
WBC: 5.2 10*3/uL (ref 4.0–10.5)

## 2023-07-12 LAB — COMPREHENSIVE METABOLIC PANEL
ALT: 33 U/L (ref 0–35)
AST: 26 U/L (ref 0–37)
Albumin: 4.4 g/dL (ref 3.5–5.2)
Alkaline Phosphatase: 70 U/L (ref 39–117)
BUN: 9 mg/dL (ref 6–23)
CO2: 28 meq/L (ref 19–32)
Calcium: 9.3 mg/dL (ref 8.4–10.5)
Chloride: 103 meq/L (ref 96–112)
Creatinine, Ser: 0.76 mg/dL (ref 0.40–1.20)
GFR: 90.26 mL/min (ref >60.00)
Glucose, Bld: 88 mg/dL (ref 70–99)
Potassium: 3.8 meq/L (ref 3.5–5.1)
Sodium: 139 meq/L (ref 135–145)
Total Bilirubin: 0.7 mg/dL (ref 0.2–1.2)
Total Protein: 7 g/dL (ref 6.0–8.3)

## 2023-07-12 LAB — VITAMIN D 25 HYDROXY (VIT D DEFICIENCY, FRACTURES): VITD: 35.46 ng/mL (ref 30.00–100.00)

## 2023-07-12 LAB — VITAMIN B12: Vitamin B-12: 726 pg/mL (ref 211–911)

## 2023-07-12 LAB — HEMOGLOBIN A1C: Hgb A1c MFr Bld: 4.6 % (ref 4.6–6.5)

## 2023-07-12 LAB — TSH: TSH: 1.77 u[IU]/mL (ref 0.35–5.50)

## 2023-07-12 NOTE — Progress Notes (Signed)
 Patient ID: Lynn Ortiz, female    DOB: Jun 13, 1970, 53 y.o.   MRN: [id number]  This visit was conducted in person.  BP 100/68 (BP Location: Left Arm, Patient Position: Sitting, Cuff Size: Large)   Pulse 82   Temp 97.9 F (36.6 C) (Temporal)   Ht 5\' 3"  (1.6 m)   Wt 185 lb 2 oz (84 kg)   SpO2 97%   BMI 32.79 kg/m    CC:  Chief Complaint  Patient presents with   Menopause    Discuss Hormonal Therapy    Subjective:   HPI: Lynn Ortiz is a 53 y.o. female presenting on 07/12/2023 for Menopause (Discuss Hormonal Therapy)   She has noted last menses in 12/2022... only 2-3 menses in  2024.  She has been feeling very tired. Minimal hot flashes.. tends to feel more cold.  She has noted moodiness and irritability.  Has noted decreased memory foggy thinking.  Sleeping okay at night.  Wellbutrin minimally helpful. Venlafaxine cause SE.   She has been working on regular exercise... 3 -4 days a week... doing cardio/ weight training. Moderate eating habits.. not over eating,  probably more pasta.  Wt Readings from Last 3 Encounters:  07/12/23 185 lb 2 oz (84 kg)  08/19/22 171 lb (77.6 kg)  04/13/22 170 lb (77.1 kg)   Body mass index is 32.79 kg/m.    Has family history of obesity and alzheimer's   No family history of CAD/CVA.. father  smoker with COPD, mother with DM  No personal history of DVT. The 10-year ASCVD risk score (Arnett DK, et al., 2019) is: 0.7%   Values used to calculate the score:     Age: 42 years     Sex: Female     Is Non-Hispanic African American: No     Diabetic: No     Tobacco smoker: No     Systolic Blood Pressure: 100 mmHg     Is BP treated: No     HDL Cholesterol: 65.2 mg/dL     Total Cholesterol: 193 mg/dL   NCI breast cancer risk 1.5% 5 year  Lifetime 11.8 % risk      08/2022 Nm TSH, cbc, B12,  iron panel.  Was having fatigue then but 5 times worse.  Relevant past medical, surgical, family and social history reviewed and updated as  indicated. Interim medical history since our last visit reviewed. Allergies and medications reviewed and updated. Outpatient Medications Prior to Visit  Medication Sig Dispense Refill   CALCIUM PO Take 1 tablet by mouth daily.     Coenzyme Q10 (COQ10 PO) Take 1 capsule by mouth daily.     cyanocobalamin (VITAMIN B12) 1000 MCG tablet Take 1,000 mcg by mouth daily.     melatonin 5 MG TABS Take 5 mg by mouth at bedtime as needed.     Multiple Vitamin (MULTIVITAMIN) tablet Take 1 tablet by mouth daily.     VITAMIN D PO Take 1 Capful by mouth daily.     albuterol (VENTOLIN HFA) 108 (90 Base) MCG/ACT inhaler Inhale 1-2 puffs into the lungs every 6 (six) hours as needed. (Patient not taking: Reported on 08/19/2022) 18 g 0   buPROPion (WELLBUTRIN XL) 300 MG 24 hr tablet TAKE 1 TABLET BY MOUTH EVERY DAY (Patient not taking: Reported on 08/19/2022) 90 tablet 0   diclofenac (VOLTAREN) 75 MG EC tablet Take 1 tablet (75 mg total) by mouth 2 (two) times daily. (Patient not taking: Reported on  08/19/2022) 30 tablet 0   predniSONE (STERAPRED UNI-PAK 21 TAB) 10 MG (21) TBPK tablet Take by mouth daily. As directed (Patient not taking: Reported on 08/19/2022) 21 tablet 0   No facility-administered medications prior to visit.     Per HPI unless specifically indicated in ROS section below Review of Systems  Constitutional:  Negative for fatigue and fever.  HENT:  Negative for congestion.   Eyes:  Negative for pain.  Respiratory:  Negative for cough and shortness of breath.   Cardiovascular:  Negative for chest pain, palpitations and leg swelling.  Gastrointestinal:  Negative for abdominal pain.  Genitourinary:  Negative for dysuria and vaginal bleeding.  Musculoskeletal:  Negative for back pain.  Neurological:  Negative for syncope, light-headedness and headaches.  Psychiatric/Behavioral:  Negative for dysphoric mood.    Objective:  BP 100/68 (BP Location: Left Arm, Patient Position: Sitting, Cuff Size: Large)    Pulse 82   Temp 97.9 F (36.6 C) (Temporal)   Ht 5\' 3"  (1.6 m)   Wt 185 lb 2 oz (84 kg)   SpO2 97%   BMI 32.79 kg/m   Wt Readings from Last 3 Encounters:  07/12/23 185 lb 2 oz (84 kg)  08/19/22 171 lb (77.6 kg)  04/13/22 170 lb (77.1 kg)      Physical Exam Constitutional:      General: She is not in acute distress.    Appearance: Normal appearance. She is well-developed. She is not ill-appearing or toxic-appearing.  HENT:     Head: Normocephalic.     Right Ear: Hearing, tympanic membrane, ear canal and external ear normal. Tympanic membrane is not erythematous, retracted or bulging.     Left Ear: Hearing, tympanic membrane, ear canal and external ear normal. Tympanic membrane is not erythematous, retracted or bulging.     Nose: No mucosal edema or rhinorrhea.     Right Sinus: No maxillary sinus tenderness or frontal sinus tenderness.     Left Sinus: No maxillary sinus tenderness or frontal sinus tenderness.     Mouth/Throat:     Pharynx: Uvula midline.  Eyes:     General: Lids are normal. Lids are everted, no foreign bodies appreciated.     Conjunctiva/sclera: Conjunctivae normal.     Pupils: Pupils are equal, round, and reactive to light.  Neck:     Thyroid: No thyroid mass or thyromegaly.     Vascular: No carotid bruit.     Trachea: Trachea normal.  Cardiovascular:     Rate and Rhythm: Normal rate and regular rhythm.     Pulses: Normal pulses.     Heart sounds: Normal heart sounds, S1 normal and S2 normal. No murmur heard.    No friction rub. No gallop.  Pulmonary:     Effort: Pulmonary effort is normal. No tachypnea or respiratory distress.     Breath sounds: Normal breath sounds. No decreased breath sounds, wheezing, rhonchi or rales.  Abdominal:     General: Bowel sounds are normal.     Palpations: Abdomen is soft.     Tenderness: There is no abdominal tenderness.  Musculoskeletal:     Cervical back: Normal range of motion and neck supple.  Skin:    General:  Skin is warm and dry.     Findings: No rash.  Neurological:     Mental Status: She is alert.  Psychiatric:        Mood and Affect: Mood is not anxious or depressed.  Speech: Speech normal.        Behavior: Behavior normal. Behavior is cooperative.        Thought Content: Thought content normal.        Judgment: Judgment normal.       Results for orders placed or performed in visit on 07/12/23  Hemoglobin A1c   Collection Time: 07/12/23  9:15 AM  Result Value Ref Range   Hgb A1c MFr Bld 4.6 4.6 - 6.5 %  Comprehensive metabolic panel   Collection Time: 07/12/23  9:15 AM  Result Value Ref Range   Sodium 139 135 - 145 mEq/L   Potassium 3.8 3.5 - 5.1 mEq/L   Chloride 103 96 - 112 mEq/L   CO2 28 19 - 32 mEq/L   Glucose, Bld 88 70 - 99 mg/dL   BUN 9 6 - 23 mg/dL   Creatinine, Ser 1.61 0.40 - 1.20 mg/dL   Total Bilirubin 0.7 0.2 - 1.2 mg/dL   Alkaline Phosphatase 70 39 - 117 U/L   AST 26 0 - 37 U/L   ALT 33 0 - 35 U/L   Total Protein 7.0 6.0 - 8.3 g/dL   Albumin 4.4 3.5 - 5.2 g/dL   GFR 09.60 >45.40 mL/min   Calcium 9.3 8.4 - 10.5 mg/dL  CBC with Differential/Platelet   Collection Time: 07/12/23  9:15 AM  Result Value Ref Range   WBC 5.2 4.0 - 10.5 K/uL   RBC 4.02 3.87 - 5.11 Mil/uL   Hemoglobin 13.4 12.0 - 15.0 g/dL   HCT 98.1 19.1 - 47.8 %   MCV 96.4 78.0 - 100.0 fl   MCHC 34.7 30.0 - 36.0 g/dL   RDW 29.5 62.1 - 30.8 %   Platelets 200.0 150.0 - 400.0 K/uL   Neutrophils Relative % 54.9 43.0 - 77.0 %   Lymphocytes Relative 33.1 12.0 - 46.0 %   Monocytes Relative 10.0 3.0 - 12.0 %   Eosinophils Relative 1.8 0.0 - 5.0 %   Basophils Relative 0.2 0.0 - 3.0 %   Neutro Abs 2.8 1.4 - 7.7 K/uL   Lymphs Abs 1.7 0.7 - 4.0 K/uL   Monocytes Absolute 0.5 0.1 - 1.0 K/uL   Eosinophils Absolute 0.1 0.0 - 0.7 K/uL   Basophils Absolute 0.0 0.0 - 0.1 K/uL  Vitamin B12   Collection Time: 07/12/23  9:15 AM  Result Value Ref Range   Vitamin B-12 726 211 - 911 pg/mL  TSH    Collection Time: 07/12/23  9:15 AM  Result Value Ref Range   TSH 1.77 0.35 - 5.50 uIU/mL  VITAMIN D 25 Hydroxy (Vit-D Deficiency, Fractures)   Collection Time: 07/12/23  9:15 AM  Result Value Ref Range   VITD 35.46 30.00 - 100.00 ng/mL  Estradiol   Collection Time: 07/12/23  9:15 AM  Result Value Ref Range   Estradiol 128 pg/mL    Assessment and Plan  Other fatigue -     Hemoglobin A1c -     Comprehensive metabolic panel with GFR -     CBC with Differential/Platelet -     Vitamin B12 -     TSH -     VITAMIN D 25 Hydroxy (Vit-D Deficiency, Fractures) -     Estradiol  Menopausal symptom -     Estradiol  Family history of diabetes mellitus -     Hemoglobin A1c   Will evaluate with labs but symptoms of fatigue most likely secondary to menopausal syndrome. We discussed in detail pros and  cons of hormone replacement therapy.  She is low risk for breast cancer and cardiovascular disease and therefore is a good candidate for hormone replacement.  Depending on the results of the labs we will consider moving forward with a post estrogen/progesterone combo. We discussed course of treatment of 4-5 years and need to likely titrate up to restart symptoms. No follow-ups on file.   Kerby Nora, MD

## 2023-07-12 NOTE — Patient Instructions (Signed)
 Discuss with  insurance about GLP1 medication:  Zepbound or [id number].

## 2023-07-13 ENCOUNTER — Encounter: Payer: Self-pay | Admitting: Family Medicine

## 2023-07-13 LAB — ESTRADIOL: Estradiol: 128 pg/mL

## 2023-07-15 ENCOUNTER — Other Ambulatory Visit: Payer: Self-pay | Admitting: Family Medicine

## 2023-07-15 MED ORDER — ESTRADIOL 0.5 MG PO TABS: 0.5000 mg | ORAL_TABLET | Freq: Every day | ORAL | 11 refills | Status: DC

## 2023-07-15 MED ORDER — PROGESTERONE MICRONIZED 100 MG PO CAPS: 100.0000 mg | ORAL_CAPSULE | Freq: Every day | ORAL | 11 refills | Status: AC

## 2023-07-15 NOTE — Progress Notes (Signed)
 For estradiol, the usual initial dose is 0.5 mg orally or 0.0375 mg/day transdermally. For progesterone, the initial dose is 100 mg orally each evening with food.[1-3]  Continue for  no longer than 3-5 years

## 2023-08-08 ENCOUNTER — Other Ambulatory Visit: Payer: Self-pay | Admitting: Family Medicine

## 2024-05-09 ENCOUNTER — Telehealth: Payer: Self-pay | Admitting: *Deleted

## 2024-05-09 ENCOUNTER — Other Ambulatory Visit: Payer: Self-pay | Admitting: Family Medicine

## 2024-05-09 DIAGNOSIS — D509 Iron deficiency anemia, unspecified: Secondary | ICD-10-CM

## 2024-05-09 DIAGNOSIS — R5383 Other fatigue: Secondary | ICD-10-CM

## 2024-05-09 DIAGNOSIS — E559 Vitamin D deficiency, unspecified: Secondary | ICD-10-CM

## 2024-05-09 DIAGNOSIS — Z13220 Encounter for screening for lipoid disorders: Secondary | ICD-10-CM

## 2024-05-09 NOTE — Telephone Encounter (Signed)
 Copied from CRM [id number]. Topic: Clinical - Request for Lab/Test Order >> May 09, 2024 12:29 PM Harlene ORN wrote: Reason for CRM: Patient called. Requesting an order for a Mammogram and a Bone Density Test. Please call to discuss.

## 2024-05-09 NOTE — Telephone Encounter (Signed)
 Please call and schedule CPE with fasting labs prior.  Will need to be seen prior to mammogram and bone density can be ordered.

## 2024-05-16 NOTE — Telephone Encounter (Signed)
 lvm for pt to call office to schedule appt.
